# Patient Record
Sex: Female | Born: 1970 | Race: White | Hispanic: No | Marital: Married | Smoking: Former smoker
Health system: Southern US, Community
[De-identification: ages and names within clinical notes are randomized; demographics above are authoritative.]

## PROBLEM LIST (undated history)

## (undated) DIAGNOSIS — J189 Pneumonia, unspecified organism: Secondary | ICD-10-CM

## (undated) DIAGNOSIS — G4700 Insomnia, unspecified: Secondary | ICD-10-CM

## (undated) DIAGNOSIS — Z9049 Acquired absence of other specified parts of digestive tract: Secondary | ICD-10-CM

## (undated) DIAGNOSIS — R51 Headache: Secondary | ICD-10-CM

## (undated) DIAGNOSIS — F419 Anxiety disorder, unspecified: Secondary | ICD-10-CM

## (undated) DIAGNOSIS — R519 Headache, unspecified: Secondary | ICD-10-CM

## (undated) DIAGNOSIS — E669 Obesity, unspecified: Secondary | ICD-10-CM

## (undated) DIAGNOSIS — N369 Urethral disorder, unspecified: Secondary | ICD-10-CM

## (undated) DIAGNOSIS — I10 Essential (primary) hypertension: Secondary | ICD-10-CM

## (undated) DIAGNOSIS — M1990 Unspecified osteoarthritis, unspecified site: Secondary | ICD-10-CM

## (undated) DIAGNOSIS — S0300XA Dislocation of jaw, unspecified side, initial encounter: Secondary | ICD-10-CM

## (undated) HISTORY — DX: Insomnia, unspecified: G47.00

## (undated) HISTORY — DX: Urethral disorder, unspecified: N36.9

## (undated) HISTORY — DX: Acquired absence of other specified parts of digestive tract: Z90.49

## (undated) HISTORY — DX: Anxiety disorder, unspecified: F41.9

## (undated) HISTORY — PX: URETHRAL DILATION: SUR417

## (undated) HISTORY — DX: Headache: R51

## (undated) HISTORY — DX: Pneumonia, unspecified organism: J18.9

## (undated) HISTORY — DX: Obesity, unspecified: E66.9

## (undated) HISTORY — DX: Headache, unspecified: R51.9

## (undated) HISTORY — DX: Dislocation of jaw, unspecified side, initial encounter: S03.00XA

---
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
## 1999-02-18 ENCOUNTER — Other Ambulatory Visit: Admission: RE | Admit: 1999-02-18 | Discharge: 1999-02-18 | Payer: Self-pay | Admitting: Family Medicine

## 2002-03-05 ENCOUNTER — Other Ambulatory Visit: Admission: RE | Admit: 2002-03-05 | Discharge: 2002-03-05 | Payer: Self-pay | Admitting: Obstetrics and Gynecology

## 2003-03-15 ENCOUNTER — Emergency Department (HOSPITAL_COMMUNITY): Admission: AD | Admit: 2003-03-15 | Discharge: 2003-03-15 | Payer: Self-pay | Admitting: Family Medicine

## 2003-06-06 ENCOUNTER — Ambulatory Visit (HOSPITAL_COMMUNITY): Admission: RE | Admit: 2003-06-06 | Discharge: 2003-06-06 | Payer: Self-pay | Admitting: Neurosurgery

## 2003-06-24 ENCOUNTER — Encounter: Admission: RE | Admit: 2003-06-24 | Discharge: 2003-09-22 | Payer: Self-pay | Admitting: Neurosurgery

## 2005-06-25 ENCOUNTER — Encounter: Payer: Self-pay | Admitting: Vascular Surgery

## 2005-06-25 ENCOUNTER — Ambulatory Visit (HOSPITAL_COMMUNITY): Admission: RE | Admit: 2005-06-25 | Discharge: 2005-06-25 | Payer: Self-pay | Admitting: Emergency Medicine

## 2008-04-14 ENCOUNTER — Emergency Department (HOSPITAL_COMMUNITY): Admission: EM | Admit: 2008-04-14 | Discharge: 2008-04-15 | Payer: Self-pay | Admitting: Emergency Medicine

## 2008-08-10 ENCOUNTER — Inpatient Hospital Stay (HOSPITAL_COMMUNITY): Admission: AD | Admit: 2008-08-10 | Discharge: 2008-08-11 | Payer: Self-pay | Admitting: Obstetrics and Gynecology

## 2009-05-27 ENCOUNTER — Ambulatory Visit: Payer: Self-pay | Admitting: Diagnostic Radiology

## 2009-05-27 ENCOUNTER — Ambulatory Visit (HOSPITAL_BASED_OUTPATIENT_CLINIC_OR_DEPARTMENT_OTHER): Admission: RE | Admit: 2009-05-27 | Discharge: 2009-05-27 | Payer: Self-pay | Admitting: Family Medicine

## 2009-05-29 ENCOUNTER — Ambulatory Visit: Payer: Self-pay | Admitting: Diagnostic Radiology

## 2009-05-29 ENCOUNTER — Ambulatory Visit (HOSPITAL_BASED_OUTPATIENT_CLINIC_OR_DEPARTMENT_OTHER): Admission: RE | Admit: 2009-05-29 | Discharge: 2009-05-29 | Payer: Self-pay | Admitting: Family Medicine

## 2010-03-07 ENCOUNTER — Encounter: Payer: Self-pay | Admitting: Family Medicine

## 2010-05-24 LAB — CBC
HCT: 25.4 % — ABNORMAL LOW (ref 36.0–46.0)
HCT: 29 % — ABNORMAL LOW (ref 36.0–46.0)
Hemoglobin: 10 g/dL — ABNORMAL LOW (ref 12.0–15.0)
Hemoglobin: 8.8 g/dL — ABNORMAL LOW (ref 12.0–15.0)
MCHC: 34.6 g/dL (ref 30.0–36.0)
MCV: 90.2 fL (ref 78.0–100.0)
RBC: 2.84 MIL/uL — ABNORMAL LOW (ref 3.87–5.11)
RDW: 14.1 % (ref 11.5–15.5)
WBC: 13.4 10*3/uL — ABNORMAL HIGH (ref 4.0–10.5)
WBC: 15.6 10*3/uL — ABNORMAL HIGH (ref 4.0–10.5)

## 2010-05-27 LAB — COMPREHENSIVE METABOLIC PANEL
ALT: 32 U/L (ref 0–35)
AST: 28 U/L (ref 0–37)
Albumin: 3.3 g/dL — ABNORMAL LOW (ref 3.5–5.2)
Alkaline Phosphatase: 66 U/L (ref 39–117)
Calcium: 8.8 mg/dL (ref 8.4–10.5)
Creatinine, Ser: 0.52 mg/dL (ref 0.4–1.2)
GFR calc non Af Amer: >60 mL/min (ref >60)
Glucose, Bld: 94 mg/dL (ref 70–99)
Total Bilirubin: 0.4 mg/dL (ref 0.3–1.2)
Total Protein: 6.4 g/dL (ref 6.0–8.3)

## 2010-05-27 LAB — DIFFERENTIAL
Basophils Relative: 0 % (ref 0–1)
Eosinophils Absolute: 0 10*3/uL (ref 0.0–0.7)
Neutrophils Relative %: 71 % (ref 43–77)

## 2010-05-27 LAB — CBC
HCT: 33.7 % — ABNORMAL LOW (ref 36.0–46.0)
RBC: 3.48 MIL/uL — ABNORMAL LOW (ref 3.87–5.11)
RDW: 12.6 % (ref 11.5–15.5)
WBC: 11.5 10*3/uL — ABNORMAL HIGH (ref 4.0–10.5)

## 2010-05-27 LAB — LIPASE, BLOOD: Lipase: 23 U/L (ref 11–59)

## 2011-10-13 ENCOUNTER — Ambulatory Visit: Payer: Self-pay | Admitting: Internal Medicine

## 2011-11-25 ENCOUNTER — Ambulatory Visit (HOSPITAL_BASED_OUTPATIENT_CLINIC_OR_DEPARTMENT_OTHER)
Admission: RE | Admit: 2011-11-25 | Discharge: 2011-11-25 | Disposition: A | Discharge status: Home / Self Care | Payer: 59 | Source: Ambulatory Visit | Attending: Family Medicine | Admitting: Family Medicine

## 2011-11-25 ENCOUNTER — Other Ambulatory Visit (HOSPITAL_BASED_OUTPATIENT_CLINIC_OR_DEPARTMENT_OTHER): Payer: Self-pay | Admitting: Family Medicine

## 2011-11-25 DIAGNOSIS — J069 Acute upper respiratory infection, unspecified: Secondary | ICD-10-CM

## 2011-11-25 DIAGNOSIS — J018 Other acute sinusitis: Secondary | ICD-10-CM

## 2011-11-25 DIAGNOSIS — J323 Chronic sphenoidal sinusitis: Secondary | ICD-10-CM | POA: Insufficient documentation

## 2011-12-16 HISTORY — PX: INTRAUTERINE DEVICE (IUD) INSERTION: SHX5877

## 2011-12-21 ENCOUNTER — Other Ambulatory Visit (HOSPITAL_BASED_OUTPATIENT_CLINIC_OR_DEPARTMENT_OTHER): Payer: Self-pay | Admitting: Family Medicine

## 2011-12-21 DIAGNOSIS — Z1231 Encounter for screening mammogram for malignant neoplasm of breast: Secondary | ICD-10-CM

## 2011-12-22 ENCOUNTER — Inpatient Hospital Stay (HOSPITAL_BASED_OUTPATIENT_CLINIC_OR_DEPARTMENT_OTHER): Admission: RE | Admit: 2011-12-22 | Payer: 59 | Source: Ambulatory Visit

## 2011-12-23 ENCOUNTER — Inpatient Hospital Stay (HOSPITAL_BASED_OUTPATIENT_CLINIC_OR_DEPARTMENT_OTHER): Admission: RE | Admit: 2011-12-23 | Payer: 59 | Source: Ambulatory Visit

## 2011-12-23 ENCOUNTER — Ambulatory Visit (HOSPITAL_BASED_OUTPATIENT_CLINIC_OR_DEPARTMENT_OTHER)
Admission: RE | Admit: 2011-12-23 | Discharge: 2011-12-23 | Disposition: A | Discharge status: Home / Self Care | Payer: 59 | Source: Ambulatory Visit | Attending: Family Medicine | Admitting: Family Medicine

## 2011-12-23 DIAGNOSIS — Z1231 Encounter for screening mammogram for malignant neoplasm of breast: Secondary | ICD-10-CM

## 2011-12-23 DIAGNOSIS — R922 Inconclusive mammogram: Secondary | ICD-10-CM | POA: Insufficient documentation

## 2011-12-29 ENCOUNTER — Other Ambulatory Visit: Payer: Self-pay | Admitting: Family Medicine

## 2011-12-29 DIAGNOSIS — R928 Other abnormal and inconclusive findings on diagnostic imaging of breast: Secondary | ICD-10-CM

## 2012-01-06 ENCOUNTER — Ambulatory Visit
Admission: RE | Admit: 2012-01-06 | Discharge: 2012-01-06 | Disposition: A | Discharge status: Home / Self Care | Payer: 59 | Source: Ambulatory Visit | Attending: Family Medicine | Admitting: Family Medicine

## 2012-01-06 DIAGNOSIS — R928 Other abnormal and inconclusive findings on diagnostic imaging of breast: Secondary | ICD-10-CM

## 2012-01-11 ENCOUNTER — Encounter: Payer: Self-pay | Admitting: Obstetrics & Gynecology

## 2012-01-11 ENCOUNTER — Ambulatory Visit (INDEPENDENT_AMBULATORY_CARE_PROVIDER_SITE_OTHER): Payer: 59 | Admitting: Obstetrics & Gynecology

## 2012-01-11 VITALS — BP 131/85 | HR 98 | Temp 98.4°F | Resp 16 | Ht 64.0 in | Wt 170.0 lb

## 2012-01-11 DIAGNOSIS — Z124 Encounter for screening for malignant neoplasm of cervix: Secondary | ICD-10-CM

## 2012-01-11 DIAGNOSIS — Z01818 Encounter for other preprocedural examination: Secondary | ICD-10-CM

## 2012-01-11 DIAGNOSIS — Z01419 Encounter for gynecological examination (general) (routine) without abnormal findings: Secondary | ICD-10-CM

## 2012-01-11 DIAGNOSIS — Z0000 Encounter for general adult medical examination without abnormal findings: Secondary | ICD-10-CM

## 2012-01-11 DIAGNOSIS — N3946 Mixed incontinence: Secondary | ICD-10-CM

## 2012-01-11 DIAGNOSIS — Z1151 Encounter for screening for human papillomavirus (HPV): Secondary | ICD-10-CM

## 2012-01-11 MED ORDER — MISOPROSTOL 200 MCG PO TABS: ORAL_TABLET | ORAL | Status: DC

## 2012-01-11 NOTE — Addendum Note (Signed)
Addended by: Granville Lewis on: 01/11/2012 01:10 PM   Modules accepted: Orders

## 2012-01-11 NOTE — Progress Notes (Signed)
Subjective:    Valerie Merritt is a 41 y.o. female who presents for an annual exam. She complains of mixed incontinence and has to wear pads daily. She also reports her periods to be heavier and longer and she would like a Mirena for this and for birth control (uses condoms and withdrawal now) The patient is sexually active. GYN screening history: last pap: was normal. The patient wears seatbelts: yes. The patient participates in regular exercise: no. Has the patient ever been transfused or tattooed?: no. The patient reports that there is not domestic violence in her life.   Menstrual History: OB History    Grav Para Term Preterm Abortions TAB SAB Ect Mult Living   2 2 2       2       Menarche age: 12 Patient's last menstrual period was 12/23/2011.    The following portions of the patient's history were reviewed and updated as appropriate: allergies, current medications, past family history, past medical history, past social history, past surgical history and problem list.  Review of Systems A comprehensive review of systems was negative. She has been married for 20 years and denies dyspareunia. 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 She has had her flu vaccine. Lab work and mammogram are UTD.   Objective:    BP 131/85  Pulse 98  Temp 98.4 F (36.9 C) (Oral)  Resp 16  Ht 5\' 4"  (1.626 m)  Wt 170 lb (77.111 kg)  BMI 29.18 kg/m2  LMP 12/23/2011  General Appearance:    Alert, cooperative, no distress, appears stated age  Head:    Normocephalic, without obvious abnormality, atraumatic  Eyes:    PERRL, conjunctiva/corneas clear, EOM's intact, fundi    benign, both eyes  Ears:    Normal TM's and external ear canals, both ears  Nose:   Nares normal, septum midline, mucosa normal, no drainage    or sinus tenderness  Throat:   Lips, mucosa, and tongue normal; teeth and gums normal  Neck:   Supple, symmetrical, trachea midline, no adenopathy;    thyroid:  no  enlargement/tenderness/nodules; no carotid   bruit or JVD  Back:     Symmetric, no curvature, ROM normal, no CVA tenderness  Lungs:     Clear to auscultation bilaterally, respirations unlabored  Chest Wall:    No tenderness or deformity   Heart:    Regular rate and rhythm, S1 and S2 normal, no murmur, rub   or gallop  Breast Exam:    No tenderness, masses, or nipple abnormality  Abdomen:     Soft, non-tender, bowel sounds active all four quadrants,    no masses, no organomegaly  Genitalia:    Normal female without lesion, discharge or tenderness, small inclusion cyst at hymen ( 5 o'clock) NSSA, NT, no adnexal masses     Extremities:   Extremities normal, atraumatic, no cyanosis or edema  Pulses:   2+ and symmetric all extremities  Skin:   Skin color, texture, turgor normal, no rashes or lesions  Lymph nodes:   Cervical, supraclavicular, and axillary nodes normal  Neurologic:   CNII-XII intact, normal strength, sensation and reflexes    throughout  .    Assessment:    Healthy female exam.   Mixed incontinence Heavier periods and desire for birth control Plan:     Thin prep Pap smear.  Mirena prn Urology appt prn

## 2012-01-25 ENCOUNTER — Ambulatory Visit: Payer: 59 | Admitting: Obstetrics & Gynecology

## 2012-01-25 ENCOUNTER — Encounter: Payer: Self-pay | Admitting: Obstetrics & Gynecology

## 2012-01-25 ENCOUNTER — Ambulatory Visit (INDEPENDENT_AMBULATORY_CARE_PROVIDER_SITE_OTHER): Payer: 59 | Admitting: Obstetrics & Gynecology

## 2012-01-25 VITALS — BP 132/82 | HR 88 | Temp 98.2°F | Resp 16 | Ht 64.0 in | Wt 170.0 lb

## 2012-01-25 DIAGNOSIS — Z30430 Encounter for insertion of intrauterine contraceptive device: Secondary | ICD-10-CM

## 2012-01-25 DIAGNOSIS — Z01812 Encounter for preprocedural laboratory examination: Secondary | ICD-10-CM

## 2012-01-25 LAB — POCT URINE PREGNANCY: Preg Test, Ur: NEGATIVE

## 2012-01-25 MED ORDER — LEVONORGESTREL 20 MCG/24HR IU IUD
INTRAUTERINE_SYSTEM | Freq: Once | INTRAUTERINE | Status: AC
  Administered 2012-01-25: 11:00:00 via INTRAUTERINE

## 2012-01-25 NOTE — Progress Notes (Signed)
  Subjective:    Patient ID: Valerie Merritt, female    DOB: Jul 04, 1970, 41 y.o.   MRN: [id number]  HPI 41 yo MW lady who is here for Mirena insertion. She took cytotec last night in preparation for this.   Review of Systems     Objective:   Physical Exam  UPT negative, consent signed, time out done. NSSA, NT Cervix prepped with betadine. Mirena easily inserted. Uterus sounded to 8 cm. Strings cut to 3-4 cm She tolerated the procedure well.      Assessment & Plan:   Contraception as above RTC 4 weeks for string check visit

## 2012-03-07 ENCOUNTER — Ambulatory Visit: Payer: 59 | Admitting: Obstetrics & Gynecology

## 2012-03-14 ENCOUNTER — Ambulatory Visit: Payer: 59 | Admitting: Obstetrics & Gynecology

## 2012-03-31 ENCOUNTER — Other Ambulatory Visit: Payer: Self-pay

## 2012-07-19 ENCOUNTER — Encounter: Payer: Self-pay | Admitting: Family Medicine

## 2012-07-19 ENCOUNTER — Ambulatory Visit (INDEPENDENT_AMBULATORY_CARE_PROVIDER_SITE_OTHER): Payer: 59 | Admitting: Family Medicine

## 2012-07-19 VITALS — BP 121/85 | HR 77 | Ht 63.0 in | Wt 175.0 lb

## 2012-07-19 DIAGNOSIS — R51 Headache: Secondary | ICD-10-CM

## 2012-07-19 DIAGNOSIS — R519 Headache, unspecified: Secondary | ICD-10-CM

## 2012-07-19 MED ORDER — TIZANIDINE HCL 4 MG PO TABS: 4.0000 mg | ORAL_TABLET | Freq: Three times a day (TID) | ORAL | Status: DC | PRN

## 2012-07-19 NOTE — Progress Notes (Signed)
  Subjective:    Patient ID: Valerie Merritt, female    DOB: 1970/05/19, 42 y.o.   MRN: [id number]  Valerie Merritt is here today to discuss facial pain.  She feels that she might have sinusitis.    Sinusitis This is a recurrent problem. The current episode started in the past 7 days. The problem has been gradually worsening since onset. Her pain is at a severity of 3/10. The pain is mild. Associated symptoms include ear pain, headaches and sinus pressure. Past treatments include oral decongestants. The treatment provided mild relief.    Review of Systems  HENT: Positive for ear pain and sinus pressure.   Neurological: Positive for headaches.   Past Medical History  Diagnosis Date  . Anxiety   . Ureteral disorder    Family History  Problem Relation Age of Onset  . Diabetes Mother   . Heart disease Father   . Hypertension Mother   . 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  She wants to quit and plans to use the nicotine patch.     Alcohol Use:  None   Drug Use:  None   Diet:  Regular   Exercise:  Walking three times per week.    Hobbies: Read, Sowing.        Objective:   Physical Exam  Constitutional: She appears well-nourished. No distress.  HENT:  Head: Normocephalic.  Right Ear: Hearing, tympanic membrane, external ear and ear canal normal. No middle ear effusion.  Left Ear: Hearing, tympanic membrane, external ear and ear canal normal.  No middle ear effusion.  Mouth/Throat: Oropharynx is clear and moist. No oropharyngeal exudate.  Eyes: Conjunctivae are normal. Right eye exhibits no discharge.  Left eye exhibits no discharge.  Neck: Neck supple.  Cardiovascular: Normal rate, regular rhythm and normal heart sounds.  Exam reveals no gallop and no friction rub.   No murmur heard. Pulmonary/Chest: Effort normal and breath sounds normal. She has no wheezes. She exhibits no tenderness.  Lymphadenopathy:    She has no cervical adenopathy.  Neurological: She is alert.  Skin: Skin is warm and dry. No rash noted.  Psychiatric: She has a normal mood and affect.          Assessment & Plan:

## 2012-07-22 ENCOUNTER — Encounter: Payer: Self-pay | Admitting: Family Medicine

## 2012-07-22 DIAGNOSIS — R519 Headache, unspecified: Secondary | ICD-10-CM | POA: Insufficient documentation

## 2012-07-22 NOTE — Assessment & Plan Note (Signed)
Beauty's ears look normal.  She really does not have symptoms consistent with a sinus infection.  I wonder about TMJ.  She also mentioned that the pain is worse at night so this pain may be related to a tooth.  She says that she had pain like this before when she needed a root canal.  She was given some samples of Lyrica to see if this will help her pain.

## 2012-07-22 NOTE — Patient Instructions (Addendum)
1)  Facial Pain - This pain may be due to TMJ vs Facial Neuropathy vs Tooth Pain.  She will try a combination of NSAIDs and muscle relaxer and/or Lyrica.  If this does not improve, she may follow up with her dentist to make sure that this is not a problem with a tooth.    Trigeminal Neuralgia Trigeminal neuralgia is a nerve disorder that causes sudden attacks of severe facial pain. It is caused by damage to the trigeminal nerve, a major nerve in the face. It is more common in women and in the elderly, although it can also happen in younger patients. Attacks last from a few seconds to several minutes and can occur from a couple of times per year to several times per day. Trigeminal neuralgia can be a very distressing and disabling condition. Surgery may be needed in very severe cases if medical treatment does not give relief. HOME CARE INSTRUCTIONS   If your caregiver prescribed medication to help prevent attacks, take as directed.  To help prevent attacks:  Chew on the unaffected side of the mouth.  Avoid touching your face.  Avoid blasts of hot or cold air.  Men may wish to grow a beard to avoid having to shave. SEEK IMMEDIATE MEDICAL CARE IF:  Pain is unbearable and your medicine does not help.  You develop new, unexplained symptoms (problems).  You have problems that may be related to a medication you are taking. Document Released: 01/29/2000 Document Revised: 04/25/2011 Document Reviewed: 11/28/2008 [facility] Patient Information 2014 [place], [place].

## 2012-07-27 ENCOUNTER — Encounter: Payer: Self-pay | Admitting: Family Medicine

## 2012-07-27 ENCOUNTER — Ambulatory Visit (INDEPENDENT_AMBULATORY_CARE_PROVIDER_SITE_OTHER): Payer: 59 | Admitting: Family Medicine

## 2012-07-27 VITALS — BP 122/76 | HR 87 | Temp 97.5°F | Wt 175.0 lb

## 2012-07-27 DIAGNOSIS — R51 Headache: Secondary | ICD-10-CM

## 2012-07-27 DIAGNOSIS — R519 Headache, unspecified: Secondary | ICD-10-CM

## 2012-07-27 MED ORDER — CLINDAMYCIN HCL 300 MG PO CAPS: 300.0000 mg | ORAL_CAPSULE | Freq: Four times a day (QID) | ORAL | Status: AC

## 2012-07-27 NOTE — Progress Notes (Signed)
  Subjective:    Patient ID: Valerie Merritt, female    DOB: 08-11-1970, 42 y.o.   MRN: [id number]  Valerie Merritt was seen on 07/19/12 with left facial pain. At that time, we were trying to decide if her pain was due to a sinusitis, TMJ or dental pain.  She was given samples of Naprelan and Lyrica along with a prescription for Zanaflex.  She had some clindamycin at home and has taken it for the past 3 days.  This has helped her pain so she thinks that her pain is due to a tooth problem.  She has an appointment scheduled with a dentist.    Dental Pain  This is a new problem. The current episode started 1 to 4 weeks ago. The problem has been gradually worsening. The pain is at a severity of 5/10. The pain is moderate. Associated symptoms include facial pain. Pertinent negatives include no fever or sinus pressure. Treatments tried: Clindamycin for three days. The treatment provided moderate relief.   Review of Systems  Constitutional: Negative for fever.  HENT: Positive for dental problem. Negative for hearing loss, ear pain, congestion, facial swelling, rhinorrhea, sinus pressure and ear discharge.   Eyes: Negative.   Respiratory: Negative.    Past Medical History  Diagnosis Date  . Anxiety   . Ureteral disorder    Family History  Problem Relation Age of Onset  . Diabetes Mother   . Heart disease Father   . Hypertension Mother   . 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  She wants to quit and plans to use the nicotine patch.     Alcohol Use:  None   Drug Use:  None   Diet:  Regular   Exercise:  Walking three  times per week.    Hobbies: Read, Sowing.          Objective:   Physical Exam  Constitutional: She appears well-nourished. No distress.  Pulmonary/Chest: Effort normal and breath sounds normal.  Neurological: She is alert.  Skin: Skin is warm and dry.  Psychiatric: She has a normal mood and affect.          Assessment & Plan:

## 2012-08-26 DIAGNOSIS — R519 Headache, unspecified: Secondary | ICD-10-CM | POA: Insufficient documentation

## 2012-08-26 NOTE — Assessment & Plan Note (Signed)
She was given a prescription for Clindamycin.  She will follow up with her dentist.

## 2012-09-27 ENCOUNTER — Other Ambulatory Visit: Payer: 59

## 2012-09-28 ENCOUNTER — Other Ambulatory Visit: Payer: Self-pay | Admitting: *Deleted

## 2012-09-28 ENCOUNTER — Other Ambulatory Visit: Payer: 59

## 2012-09-28 DIAGNOSIS — E559 Vitamin D deficiency, unspecified: Secondary | ICD-10-CM

## 2012-09-28 DIAGNOSIS — Z0000 Encounter for general adult medical examination without abnormal findings: Secondary | ICD-10-CM

## 2012-09-28 LAB — CBC WITH DIFFERENTIAL/PLATELET
Basophils Absolute: 0 10*3/uL (ref 0.0–0.1)
Basophils Relative: 0 % (ref 0–1)
Eosinophils Absolute: 0.1 10*3/uL (ref 0.0–0.7)
Eosinophils Relative: 1 % (ref 0–5)
HCT: 38.1 % (ref 36.0–46.0)
Hemoglobin: 13.2 g/dL (ref 12.0–15.0)
Lymphocytes Relative: 35 % (ref 12–46)
Lymphs Abs: 2.9 10*3/uL (ref 0.7–4.0)
MCH: 30.4 pg (ref 26.0–34.0)
MCHC: 34.6 g/dL (ref 30.0–36.0)
MCV: 87.8 fL (ref 78.0–100.0)
Monocytes Absolute: 0.4 10*3/uL (ref 0.1–1.0)
Monocytes Relative: 5 % (ref 3–12)
Neutro Abs: 4.8 10*3/uL (ref 1.7–7.7)
Neutrophils Relative %: 59 % (ref 43–77)
Platelets: 336 10*3/uL (ref 150–400)
RBC: 4.34 MIL/uL (ref 3.87–5.11)
RDW: 14.9 % (ref 11.5–15.5)
WBC: 8.3 10*3/uL (ref 4.0–10.5)

## 2012-09-28 LAB — COMPLETE METABOLIC PANEL WITH GFR
ALT: 23 U/L (ref 0–35)
AST: 16 U/L (ref 0–37)
Albumin: 4.3 g/dL (ref 3.5–5.2)
Alkaline Phosphatase: 73 U/L (ref 39–117)
BUN: 12 mg/dL (ref 6–23)
CO2: 21 mEq/L (ref 19–32)
Calcium: 9 mg/dL (ref 8.4–10.5)
Chloride: 110 mEq/L (ref 96–112)
Creat: 0.84 mg/dL (ref 0.50–1.10)
GFR, Est African American: >89 mL/min
GFR, Est Non African American: 87 mL/min
Glucose, Bld: 110 mg/dL — ABNORMAL HIGH (ref 70–99)
Potassium: 4.4 mEq/L (ref 3.5–5.3)
Sodium: 139 mEq/L (ref 135–145)
Total Bilirubin: 0.5 mg/dL (ref 0.3–1.2)
Total Protein: 6.8 g/dL (ref 6.0–8.3)

## 2012-09-28 LAB — LIPID PANEL
Cholesterol: 162 mg/dL (ref 0–200)
HDL: 35 mg/dL — ABNORMAL LOW (ref >39)
LDL Cholesterol: 109 mg/dL — ABNORMAL HIGH (ref 0–99)
Total CHOL/HDL Ratio: 4.6 Ratio
Triglycerides: 89 mg/dL (ref <150)
VLDL: 18 mg/dL (ref 0–40)

## 2012-09-28 LAB — TSH: TSH: 2.029 u[IU]/mL (ref 0.350–4.500)

## 2012-09-29 LAB — VITAMIN D 25 HYDROXY (VIT D DEFICIENCY, FRACTURES): Vit D, 25-Hydroxy: 44 ng/mL (ref 30–89)

## 2012-10-12 ENCOUNTER — Ambulatory Visit: Payer: 59 | Admitting: Family Medicine

## 2012-10-19 ENCOUNTER — Encounter: Payer: Self-pay | Admitting: Family Medicine

## 2012-10-19 ENCOUNTER — Ambulatory Visit (INDEPENDENT_AMBULATORY_CARE_PROVIDER_SITE_OTHER): Payer: 59 | Admitting: Family Medicine

## 2012-10-19 VITALS — BP 114/78 | HR 90 | Wt 180.0 lb

## 2012-10-19 DIAGNOSIS — E669 Obesity, unspecified: Secondary | ICD-10-CM

## 2012-10-19 DIAGNOSIS — S0300XD Dislocation of jaw, unspecified side, subsequent encounter: Secondary | ICD-10-CM

## 2012-10-19 DIAGNOSIS — G4700 Insomnia, unspecified: Secondary | ICD-10-CM

## 2012-10-19 DIAGNOSIS — F411 Generalized anxiety disorder: Secondary | ICD-10-CM

## 2012-10-19 DIAGNOSIS — Z5189 Encounter for other specified aftercare: Secondary | ICD-10-CM

## 2012-10-19 MED ORDER — PHENDIMETRAZINE TARTRATE 35 MG PO TABS: 1.0000 | ORAL_TABLET | Freq: Three times a day (TID) | ORAL | Status: DC | PRN

## 2012-10-19 MED ORDER — ZALEPLON 10 MG PO CAPS: 10.0000 mg | ORAL_CAPSULE | Freq: Every day | ORAL | Status: DC

## 2012-10-19 MED ORDER — NAPROXEN SODIUM ER 750 MG PO TB24: 1.0000 | ORAL_TABLET | Freq: Two times a day (BID) | ORAL | Status: DC

## 2012-10-19 MED ORDER — CITALOPRAM HYDROBROMIDE 40 MG PO TABS: 40.0000 mg | ORAL_TABLET | Freq: Every day | ORAL | Status: DC

## 2012-10-19 NOTE — Progress Notes (Signed)
  Subjective:    Patient ID: Valerie Merritt, female    DOB: 1970/09/10, 42 y.o.   MRN: [id number]  HPI  Valerie Merritt is here today to go over her most recent lab results, to discuss the conditions listed below and to get some of her medication refilled.    1)  Mood:  She continues to do well with her Celexa 40 mg and needs to have it refilled.   2)  Sleeping Disturbances:  She has been struggling with this problem for several months.  She says that her husband has noticed that she is snoring more than she has in the past.  She is also grinding her teeth at night.  She has tried several home remedies and OTC treatments which have not helped her.    Review of Systems  Constitutional: Positive for unexpected weight change.  HENT: Negative.   Eyes: Negative.   Respiratory: Negative.   Cardiovascular: Negative.   Gastrointestinal: Negative.   Endocrine: Negative.   Genitourinary: Negative.   Musculoskeletal: Negative.   Skin: Negative.   Allergic/Immunologic: Negative.   Neurological: Negative.   Hematological: Negative.   Psychiatric/Behavioral: Positive for sleep disturbance.     Past Medical History  Diagnosis Date  . Anxiety   . Ureteral disorder   . TMJ (dislocation of temporomandibular joint)   . Insomnia   . Obesity      Past Surgical History  Procedure Laterality Date  . Appendectomy    . Intrauterine device (iud) insertion  12/2011     Family History  Problem Relation Age of Onset  . Diabetes Mother   . Heart disease Father   . Hypertension Mother   . Cancer - Colon Maternal Grandfather     History   Social History Narrative   Marital Status: Married Curator) [place]   Children:  Daughter Huntley Dec) Son Pamelia Hoit)     Pets: Dogs (3) Cat (1) Bird (1) Chickens (>20)  Ducks (>4)    Living Situation: Lives with husband and children.   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  She wants to quit and plans to use the nicotine patch.     Alcohol Use:  None    Drug Use:  None   Diet:  Regular   Exercise:  Walking three times per week.    Hobbies: Reading, Sewing       Objective:   Physical Exam  Vitals reviewed. Constitutional: She is oriented to person, place, and time. She appears well-developed and well-nourished. No distress.  Eyes: Conjunctivae are normal. No scleral icterus.  Neck: Neck supple. No thyromegaly present.  Cardiovascular: Normal rate, regular rhythm and normal heart sounds.   Pulmonary/Chest: Effort normal and breath sounds normal.  Musculoskeletal: She exhibits no edema and no tenderness.  Lymphadenopathy:    She has no cervical adenopathy.  Neurological: She is alert and oriented to person, place, and time.  Skin: Skin is warm and dry.  Psychiatric: She has a normal mood and affect. Her behavior is normal. Judgment and thought content normal.      Assessment & Plan:

## 2012-11-23 ENCOUNTER — Other Ambulatory Visit: Payer: Self-pay | Admitting: Family Medicine

## 2012-11-23 DIAGNOSIS — F411 Generalized anxiety disorder: Secondary | ICD-10-CM

## 2012-11-23 MED ORDER — DIAZEPAM 5 MG PO TABS: ORAL_TABLET | ORAL | Status: AC

## 2012-12-19 ENCOUNTER — Other Ambulatory Visit: Payer: Self-pay | Admitting: Family Medicine

## 2012-12-20 ENCOUNTER — Other Ambulatory Visit: Payer: Self-pay

## 2012-12-23 ENCOUNTER — Encounter: Payer: Self-pay | Admitting: Family Medicine

## 2012-12-23 DIAGNOSIS — G4700 Insomnia, unspecified: Secondary | ICD-10-CM | POA: Insufficient documentation

## 2012-12-23 DIAGNOSIS — F411 Generalized anxiety disorder: Secondary | ICD-10-CM | POA: Insufficient documentation

## 2012-12-23 DIAGNOSIS — S0300XA Dislocation of jaw, unspecified side, initial encounter: Secondary | ICD-10-CM | POA: Insufficient documentation

## 2012-12-23 DIAGNOSIS — E669 Obesity, unspecified: Secondary | ICD-10-CM | POA: Insufficient documentation

## 2012-12-23 NOTE — Assessment & Plan Note (Addendum)
Her mood is well controlled on citalopram 40 mg so she will stay on this dosage.

## 2012-12-23 NOTE — Assessment & Plan Note (Signed)
She was given a prescription for the Naprelan which worked well for her TMJ.

## 2012-12-23 NOTE — Assessment & Plan Note (Addendum)
She was given a prescription for Phendimetrazine to try to help her lose weight.  This should help decrease her snoring.

## 2012-12-23 NOTE — Assessment & Plan Note (Signed)
She has some difficulty at times with sleeping when she goes back and forth from days to nights in the ED.  She was given some Sonata to take occasionally.

## 2012-12-28 ENCOUNTER — Encounter: Payer: Self-pay | Admitting: Family Medicine

## 2012-12-28 ENCOUNTER — Ambulatory Visit (INDEPENDENT_AMBULATORY_CARE_PROVIDER_SITE_OTHER): Payer: 59 | Admitting: Family Medicine

## 2012-12-28 VITALS — BP 120/79 | HR 90 | Resp 16 | Ht 64.0 in | Wt 185.0 lb

## 2012-12-28 DIAGNOSIS — F17200 Nicotine dependence, unspecified, uncomplicated: Secondary | ICD-10-CM

## 2012-12-28 DIAGNOSIS — F411 Generalized anxiety disorder: Secondary | ICD-10-CM

## 2012-12-28 DIAGNOSIS — Z23 Encounter for immunization: Secondary | ICD-10-CM

## 2012-12-28 DIAGNOSIS — N760 Acute vaginitis: Secondary | ICD-10-CM

## 2012-12-28 DIAGNOSIS — Z0000 Encounter for general adult medical examination without abnormal findings: Secondary | ICD-10-CM

## 2012-12-28 LAB — POCT URINALYSIS DIPSTICK
Bilirubin, UA: NEGATIVE
Blood, UA: NEGATIVE
Glucose, UA: NEGATIVE
Ketones, UA: NEGATIVE
Leukocytes, UA: NEGATIVE
Nitrite, UA: NEGATIVE
Protein, UA: NEGATIVE
Spec Grav, UA: 1.02
Urobilinogen, UA: NEGATIVE
pH, UA: 5

## 2012-12-28 MED ORDER — CITALOPRAM HYDROBROMIDE 40 MG PO TABS: 40.0000 mg | ORAL_TABLET | Freq: Every day | ORAL | Status: DC

## 2012-12-28 MED ORDER — METRONIDAZOLE 0.75 % VA GEL: 1.0000 | Freq: Two times a day (BID) | VAGINAL | Status: DC

## 2012-12-28 NOTE — Patient Instructions (Signed)
1)  Preventative Care - You received your Tdap and Pneumovax at today's visit.   Complete the stool cards and return.  2)  Smoking Cessation - YOU CAN DO IT!!     You Can Quit Smoking If you are ready to quit smoking or are thinking about it, congratulations! You have chosen to help yourself be healthier and live longer! There are lots of different ways to quit smoking. Nicotine gum, nicotine patches, a nicotine inhaler, or nicotine nasal spray can help with physical craving. Hypnosis, support groups, and medicines help break the habit of smoking. TIPS TO GET OFF AND STAY OFF CIGARETTES  Learn to predict your moods. Do not let a bad situation be your excuse to have a cigarette. Some situations in your life might tempt you to have a cigarette.  Ask friends and co-workers not to smoke around you.  Make your home smoke-free.  Never have "just one" cigarette. It leads to wanting another and another. Remind yourself of your decision to quit.  On a card, make a list of your reasons for not smoking. Read it at least the same number of times a day as you have a cigarette. Tell yourself everyday, "I do not want to smoke. I choose not to smoke."  Ask someone at home or work to help you with your plan to quit smoking.  Have something planned after you eat or have a cup of coffee. Take a walk or get other exercise to perk you up. This will help to keep you from overeating.  Try a relaxation exercise to calm you down and decrease your stress. Remember, you may be tense and nervous the first two weeks after you quit. This will pass.  Find new activities to keep your hands busy. Play with a pen, coin, or rubber band. Doodle or draw things on paper.  Brush your teeth right after eating. This will help cut down the craving for the taste of tobacco after meals. You can try mouthwash too.  Try gum, breath mints, or diet candy to keep something in your mouth. IF YOU SMOKE AND WANT TO QUIT:  Do not stock  up on cigarettes. Never buy a carton. Wait until one pack is finished before you buy another.  Never carry cigarettes with you at work or at home.  Keep cigarettes as far away from you as possible. Leave them with someone else.  Never carry matches or a lighter with you.  Ask yourself, "Do I need this cigarette or is this just a reflex?"  Bet with someone that you can quit. Put cigarette money in a piggy bank every morning. If you smoke, you give up the money. If you do not smoke, by the end of the week, you keep the money.  Keep trying. It takes 21 days to change a habit!  Talk to your doctor about using medicines to help you quit. These include nicotine replacement gum, lozenges, or skin patches. Document Released: 11/27/2008 Document Revised: 04/25/2011 Document Reviewed: 11/27/2008 [facility] Patient Information 2014 [place], [place].

## 2012-12-28 NOTE — Progress Notes (Signed)
Subjective:    Patient ID: Valerie Merritt, female    DOB: May 22, 1970, 42 y.o.   MRN: [id number]  HPI  Everlynn is here today for her annual CPE.  She does not need a pap because she had one last year which was normal and she was noted to be negative for HPV.  She has done well since her last office visit.  She is still trying to quit smoking.     Review of Systems  Constitutional: Negative.   HENT: Negative.   Eyes: Negative.   Respiratory: Negative.   Cardiovascular: Negative.   Gastrointestinal: Negative.   Endocrine: Negative.   Genitourinary: Positive for vaginal discharge.  Musculoskeletal: Negative.   Skin: Negative.   Allergic/Immunologic: Negative.   Neurological: Negative.   Hematological: Negative.   Psychiatric/Behavioral: Negative.       Past Medical History  Diagnosis Date  . Anxiety   . TMJ (dislocation of temporomandibular joint)   . Insomnia   . Obesity   . 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    . Pneumonia      Past Surgical History  Procedure Laterality Date  . Appendectomy  1985  . Intrauterine device (iud) insertion  12/2011  . Urethral dilation       History   Social History Narrative   Marital Status: Married Curator) [place]   Children:  Daughter Huntley Dec) Son Pamelia Hoit)     Pets: Dogs (3) Cat (1) Bird (1) Chickens (>20)  Ducks (>4)    Living Situation: Lives with husband and children.   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  She wants to quit and is currently the nicotine patch.     Alcohol Use:  None    Drug Use:  None   Diet:  Regular   Exercise:  Walking three times per week.    Hobbies: Reading, Sewing       Family History  Problem Relation Age of Onset  . Diabetes Mother   . Hypertension Mother   . Heart disease Father   . Cancer - Colon Maternal  Grandfather   . Cancer Maternal Grandfather 50    Colon   . Multiple sclerosis Sister   . Hepatitis C Brother   . Other Brother     Avascular Necrosis  . Drug abuse Brother     Crack, Pain Meds   . Cancer Daughter     Hodgkin's Lymphoma   . Stroke Maternal Grandmother   . Hypertension Maternal Grandmother   . Cancer Sister     Brain      Current Outpatient Prescriptions on File Prior to Visit  Medication Sig Dispense Refill  . zaleplon (SONATA) 10 MG capsule Take 1 capsule (10 mg total) by mouth at bedtime.  30 capsule  1   No current facility-administered medications on file prior to visit.     Allergies  Allergen Reactions  . Ivp Dye [Iodinated Diagnostic Agents] Anaphylaxis  . Penicillins Rash     Immunization History  Administered Date(s) Administered  . Influenza-Unspecified 10/29/2012  . Pneumococcal Polysaccharide-23 12/28/2012  . Tdap 12/28/2012      Objective:   Physical Exam  Vitals reviewed. Constitutional: She is oriented to person, place, and time. She appears well-developed and well-nourished.  HENT:  Head: Normocephalic and  atraumatic.  Right Ear: External ear normal.  Left Ear: External ear normal.  Nose: Nose normal.  Mouth/Throat: Oropharynx is clear and moist.  Eyes: Conjunctivae and EOM are normal. Pupils are equal, round, and reactive to light.  Neck: Normal range of motion. No thyromegaly present.  Cardiovascular: Normal rate, regular rhythm, normal heart sounds and intact distal pulses.  Exam reveals no gallop and no friction rub.   No murmur heard. Pulmonary/Chest: Effort normal and breath sounds normal. Right breast exhibits no inverted nipple, no mass, no nipple discharge, no skin change and no tenderness. Left breast exhibits no inverted nipple, no mass, no nipple discharge, no skin change and no tenderness. Breasts are symmetrical.  Abdominal: Soft. Bowel sounds are normal.  Musculoskeletal: Normal range of motion. She exhibits no  edema and no tenderness.  Lymphadenopathy:    She has no cervical adenopathy.  Neurological: She is alert and oriented to person, place, and time. She has normal reflexes.  Skin: Skin is warm and dry.  Psychiatric: She has a normal mood and affect. Her behavior is normal. Judgment and thought content normal.      Assessment & Plan:    Shatasha was seen today for annual exam and medication refills.    Diagnoses and associated orders for this visit:  Routine general medical examination at a health care facility - POCT urinalysis dipstick  Need for prophylactic vaccination with combined diphtheria-tetanus-pertussis (DTP) vaccine - Tdap vaccine greater than or equal to 7yo IM  Need for prophylactic vaccination against Streptococcus pneumoniae (pneumococcus) - Pneumococcal polysaccharide vaccine 23-valent greater than or equal to 2yo subcutaneous/IM  Tobacco use disorder - She is going to continue to work on smoking cessation.    Anxiety state, unspecified - citalopram (CELEXA) 40 MG tablet; Take 1 tablet (40 mg total) by mouth at bedtime.  Vaginitis and vulvovaginitis - Discontinue: metroNIDAZOLE (METROGEL VAGINAL) 0.75 % vaginal gel; Place 1 Applicatorful vaginally 2 (two) times daily.  TIME SPENT "FACE TO FACE" WITH PATIENT -  45 MINS

## 2013-01-02 ENCOUNTER — Other Ambulatory Visit: Payer: Self-pay | Admitting: Family Medicine

## 2013-01-02 DIAGNOSIS — H04203 Unspecified epiphora, bilateral lacrimal glands: Secondary | ICD-10-CM

## 2013-01-02 MED ORDER — AZELASTINE HCL 0.05 % OP SOLN: 1.0000 [drp] | Freq: Two times a day (BID) | OPHTHALMIC | Status: DC

## 2013-01-04 ENCOUNTER — Other Ambulatory Visit: Payer: Self-pay | Admitting: *Deleted

## 2013-01-04 DIAGNOSIS — Z1239 Encounter for other screening for malignant neoplasm of breast: Secondary | ICD-10-CM

## 2013-01-25 ENCOUNTER — Ambulatory Visit (INDEPENDENT_AMBULATORY_CARE_PROVIDER_SITE_OTHER): Payer: 59 | Admitting: Family Medicine

## 2013-01-25 ENCOUNTER — Encounter: Payer: Self-pay | Admitting: Family Medicine

## 2013-01-25 VITALS — BP 136/80 | HR 81 | Resp 16 | Wt 185.0 lb

## 2013-01-25 DIAGNOSIS — L0390 Cellulitis, unspecified: Secondary | ICD-10-CM

## 2013-01-25 DIAGNOSIS — L0291 Cutaneous abscess, unspecified: Secondary | ICD-10-CM

## 2013-01-25 MED ORDER — AZITHROMYCIN 500 MG PO TABS: 500.0000 mg | ORAL_TABLET | Freq: Every day | ORAL | Status: AC

## 2013-01-25 NOTE — Progress Notes (Signed)
Subjective:    Patient ID: Valerie Merritt, female    DOB: [birth year], 42 y.o.   MRN: [id number]  HPI  Valerie Merritt is here today complaining of pain and swelling in her right middle finger.  She has been experiencing these symptoms intermittently for the past two months.  She thinks that this problem may be related to a welding particle that she thought it may be gotten in her finger.  She also has been using wool and she wonders if the fine particles are getting in her finger.     Review of Systems  Constitutional: Negative for fever.  Musculoskeletal: Positive for arthralgias and joint swelling.  Skin:       Dry white patches on her right middle finger  Neurological: Positive for numbness.       Right middle finger      Past Medical History  Diagnosis Date  . Anxiety   . TMJ (dislocation of temporomandibular joint)   . Insomnia   . Obesity   . 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    . Pneumonia      Past Surgical History  Procedure Laterality Date  . Appendectomy  1985  . Intrauterine device (iud) insertion  12/2011  . Urethral dilation       History   Social History Narrative   Marital Status: Married Curator) [place]   Children:  Daughter Huntley Dec) Son Pamelia Hoit)     Pets: Dogs (3) Cat (1) Bird (1) Chickens (>20)  Ducks (>4)    Living Situation: Lives with husband and children.   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  She wants to quit and is currently the nicotine patch.     Alcohol Use:  None    Drug Use:  None   Diet:  Regular   Exercise:  Walking three times per week.    Hobbies: Reading, Sewing       Family History  Problem Relation Age of Onset  . Diabetes Mother   . Hypertension Mother   . Heart disease Father   . Cancer - Colon Maternal Grandfather   . Cancer Maternal  Grandfather 50    Colon   . Multiple sclerosis Sister   . Hepatitis C Brother   . Other Brother     Avascular Necrosis  . Drug abuse Brother     Crack, Pain Meds   . Cancer Daughter     Hodgkin's Lymphoma   . Stroke Maternal Grandmother   . Hypertension Maternal Grandmother   . Cancer Sister     Brain      Current Outpatient Prescriptions on File Prior to Visit  Medication Sig Dispense Refill  . citalopram (CELEXA) 40 MG tablet Take 1 tablet (40 mg total) by mouth at bedtime.  90 tablet  0  . zaleplon (SONATA) 10 MG capsule Take 1 capsule (10 mg total) by mouth at bedtime.  30 capsule  1   No current facility-administered medications on file prior to visit.     Allergies  Allergen Reactions  . Ivp Dye [Iodinated Diagnostic Agents] Anaphylaxis  . Penicillins Rash     Immunization History  Administered Date(s) Administered  . Influenza-Unspecified 10/29/2012  . Pneumococcal Polysaccharide-23 12/28/2012  . Tdap 12/28/2012  Objective:   Physical Exam  Constitutional: No distress.  Musculoskeletal: She exhibits edema and tenderness.  Right Middle Finger   Skin: Rash noted. There is erythema.      Assessment & Plan:    Valerie Merritt was seen today for hand pain.  Diagnoses and associated orders for this visit:  Cellulitis - azithromycin (ZITHROMAX) 500 MG tablet; Take 1 tablet (500 mg total) by mouth daily. Take 1 tablet daily for 3 days.

## 2013-02-04 ENCOUNTER — Other Ambulatory Visit: Payer: Self-pay | Admitting: Family Medicine

## 2013-02-04 DIAGNOSIS — Z20828 Contact with and (suspected) exposure to other viral communicable diseases: Secondary | ICD-10-CM

## 2013-02-04 MED ORDER — OSELTAMIVIR PHOSPHATE 75 MG PO CAPS: 75.0000 mg | ORAL_CAPSULE | Freq: Every day | ORAL | Status: DC

## 2013-02-18 DIAGNOSIS — Z0000 Encounter for general adult medical examination without abnormal findings: Secondary | ICD-10-CM | POA: Insufficient documentation

## 2013-02-18 DIAGNOSIS — E669 Obesity, unspecified: Secondary | ICD-10-CM | POA: Insufficient documentation

## 2013-02-21 ENCOUNTER — Ambulatory Visit: Payer: 59 | Admitting: Family Medicine

## 2013-02-22 ENCOUNTER — Ambulatory Visit (INDEPENDENT_AMBULATORY_CARE_PROVIDER_SITE_OTHER): Payer: 59 | Admitting: Family Medicine

## 2013-02-22 ENCOUNTER — Encounter (HOSPITAL_COMMUNITY): Payer: Self-pay | Admitting: Emergency Medicine

## 2013-02-22 ENCOUNTER — Emergency Department (HOSPITAL_COMMUNITY): Payer: 59

## 2013-02-22 ENCOUNTER — Inpatient Hospital Stay (HOSPITAL_COMMUNITY)
Admission: EM | Admit: 2013-02-22 | Discharge: 2013-02-23 | DRG: 203 | Disposition: A | Discharge status: Home / Self Care | Payer: 59 | Attending: Internal Medicine | Admitting: Internal Medicine

## 2013-02-22 ENCOUNTER — Encounter: Payer: Self-pay | Admitting: Family Medicine

## 2013-02-22 VITALS — BP 122/78 | HR 86 | Temp 98.8°F | Resp 16 | Wt 180.0 lb

## 2013-02-22 DIAGNOSIS — F17200 Nicotine dependence, unspecified, uncomplicated: Secondary | ICD-10-CM | POA: Diagnosis present

## 2013-02-22 DIAGNOSIS — D72829 Elevated white blood cell count, unspecified: Secondary | ICD-10-CM

## 2013-02-22 DIAGNOSIS — R05 Cough: Secondary | ICD-10-CM

## 2013-02-22 DIAGNOSIS — G4700 Insomnia, unspecified: Secondary | ICD-10-CM | POA: Diagnosis present

## 2013-02-22 DIAGNOSIS — Z79899 Other long term (current) drug therapy: Secondary | ICD-10-CM

## 2013-02-22 DIAGNOSIS — Z0000 Encounter for general adult medical examination without abnormal findings: Secondary | ICD-10-CM

## 2013-02-22 DIAGNOSIS — Z880 Allergy status to penicillin: Secondary | ICD-10-CM

## 2013-02-22 DIAGNOSIS — R519 Headache, unspecified: Secondary | ICD-10-CM

## 2013-02-22 DIAGNOSIS — J329 Chronic sinusitis, unspecified: Secondary | ICD-10-CM

## 2013-02-22 DIAGNOSIS — J189 Pneumonia, unspecified organism: Secondary | ICD-10-CM

## 2013-02-22 DIAGNOSIS — E669 Obesity, unspecified: Secondary | ICD-10-CM | POA: Diagnosis present

## 2013-02-22 DIAGNOSIS — R059 Cough, unspecified: Secondary | ICD-10-CM

## 2013-02-22 DIAGNOSIS — R51 Headache: Secondary | ICD-10-CM

## 2013-02-22 DIAGNOSIS — N3946 Mixed incontinence: Secondary | ICD-10-CM

## 2013-02-22 DIAGNOSIS — S0300XD Dislocation of jaw, unspecified side, subsequent encounter: Secondary | ICD-10-CM

## 2013-02-22 DIAGNOSIS — J209 Acute bronchitis, unspecified: Principal | ICD-10-CM | POA: Diagnosis present

## 2013-02-22 DIAGNOSIS — J069 Acute upper respiratory infection, unspecified: Secondary | ICD-10-CM | POA: Diagnosis present

## 2013-02-22 DIAGNOSIS — R0602 Shortness of breath: Secondary | ICD-10-CM

## 2013-02-22 DIAGNOSIS — F411 Generalized anxiety disorder: Secondary | ICD-10-CM | POA: Diagnosis present

## 2013-02-22 DIAGNOSIS — Z91041 Radiographic dye allergy status: Secondary | ICD-10-CM

## 2013-02-22 LAB — BASIC METABOLIC PANEL
BUN: 13 mg/dL (ref 6–23)
CO2: 20 meq/L (ref 19–32)
Calcium: 9.2 mg/dL (ref 8.4–10.5)
Chloride: 104 mEq/L (ref 96–112)
Creatinine, Ser: 0.77 mg/dL (ref 0.50–1.10)
GFR calc Af Amer: >90 mL/min (ref >90)
GFR calc non Af Amer: >90 mL/min (ref >90)
Glucose, Bld: 129 mg/dL — ABNORMAL HIGH (ref 70–99)
POTASSIUM: 4.2 meq/L (ref 3.7–5.3)
SODIUM: 141 meq/L (ref 137–147)

## 2013-02-22 LAB — D-DIMER, QUANTITATIVE (NOT AT ARMC): D-Dimer, Quant: 0.34 ug/mL-FEU (ref 0.00–0.48)

## 2013-02-22 LAB — CBC WITH DIFFERENTIAL/PLATELET
BASOS ABS: 0 10*3/uL (ref 0.0–0.1)
Basophils Relative: 0 % (ref 0–1)
EOS PCT: 0 % (ref 0–5)
Eosinophils Absolute: 0 10*3/uL (ref 0.0–0.7)
HCT: 41.1 % (ref 36.0–46.0)
Hemoglobin: 14.6 g/dL (ref 12.0–15.0)
LYMPHS ABS: 1.3 10*3/uL (ref 0.7–4.0)
LYMPHS PCT: 6 % — AB (ref 12–46)
MCH: 33.1 pg (ref 26.0–34.0)
MCHC: 35.5 g/dL (ref 30.0–36.0)
MCV: 93.2 fL (ref 78.0–100.0)
Monocytes Absolute: 0.1 10*3/uL (ref 0.1–1.0)
Monocytes Relative: 1 % — ABNORMAL LOW (ref 3–12)
NEUTROS ABS: 21.7 10*3/uL — AB (ref 1.7–7.7)
NEUTROS PCT: 94 % — AB (ref 43–77)
PLATELETS: 305 10*3/uL (ref 150–400)
RBC: 4.41 MIL/uL (ref 3.87–5.11)
RDW: 12.7 % (ref 11.5–15.5)
WBC: 23.1 10*3/uL — ABNORMAL HIGH (ref 4.0–10.5)

## 2013-02-22 MED ORDER — SODIUM CHLORIDE 0.9 % IV BOLUS (SEPSIS)
1000.0000 mL | Freq: Once | INTRAVENOUS | Status: AC
  Administered 2013-02-22: 1000 mL via INTRAVENOUS

## 2013-02-22 MED ORDER — ALBUTEROL SULFATE HFA 108 (90 BASE) MCG/ACT IN AERS
6.0000 | INHALATION_SPRAY | Freq: Once | RESPIRATORY_TRACT | Status: AC
  Administered 2013-02-22: 6 via RESPIRATORY_TRACT
  Filled 2013-02-22: qty 6.7

## 2013-02-22 MED ORDER — MOXIFLOXACIN HCL 400 MG PO TABS: 400.0000 mg | ORAL_TABLET | Freq: Every day | ORAL | Status: AC

## 2013-02-22 MED ORDER — METHYLPREDNISOLONE SODIUM SUCC 125 MG IJ SOLR
125.0000 mg | Freq: Once | INTRAMUSCULAR | Status: AC
  Administered 2013-02-22: 125 mg via INTRAMUSCULAR

## 2013-02-22 MED ORDER — EPINEPHRINE 0.3 MG/0.3ML IJ SOAJ
0.3000 mg | Freq: Once | INTRAMUSCULAR | Status: DC
  Filled 2013-02-22: qty 0.3

## 2013-02-22 MED ORDER — HYDROCOD POLST-CHLORPHEN POLST 10-8 MG/5ML PO LQCR: 5.0000 mL | Freq: Two times a day (BID) | ORAL | Status: DC | PRN

## 2013-02-22 MED ORDER — FLUTICASONE PROPIONATE 50 MCG/ACT NA SUSP: 2.0000 | Freq: Every day | NASAL | Status: DC

## 2013-02-22 NOTE — ED Provider Notes (Signed)
CSN: [id number]     Arrival date & time 02/22/13  1842 History   First MD Initiated Contact with Patient 02/22/13 1902     Chief Complaint  Patient presents with  . Shortness of Breath   (Consider location/radiation/quality/duration/timing/severity/associated sxs/prior Treatment) Patient is a 43 y.o. female presenting with shortness of breath.  Shortness of Breath Severity:  Severe Onset quality:  Sudden Duration: a few  hours ago. Timing:  Constant Progression:  Improving Chronicity:  New Context comment:  Has had sinus symptoms for over a week.  Prescribed avelox and dulera today.  Took dulera, and then 20 minutes later had heart racing and shortness of breath. Relieved by:  Rest Worsened by:  Nothing tried Ineffective treatments: EMS gave albuterol and solumedrol. Associated symptoms: cough and sputum production   Associated symptoms: no abdominal pain, no chest pain, no fever and no vomiting     Past Medical History  Diagnosis Date  . Anxiety   . TMJ (dislocation of temporomandibular joint)   . Insomnia   . Obesity   . 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     Past Surgical History  Procedure Laterality Date  . Appendectomy  1985  . Intrauterine device (iud) insertion  12/2011  . Urethral dilation     Family History  Problem Relation Age of Onset  . Diabetes Mother   . Hypertension Mother   . Heart disease Father   . Cancer - Colon Maternal Grandfather   . Cancer Maternal Grandfather 50    Colon   . Multiple sclerosis Sister   . Hepatitis C Brother   . Other Brother     Avascular Necrosis  . Drug abuse Brother     Crack, Pain Meds   . Cancer Daughter     Hodgkin's Lymphoma   . Stroke Maternal Grandmother   . Hypertension Maternal Grandmother   . Cancer Sister     Brain    History  Substance Use Topics  . Smoking status: Current Every Day Smoker -- 1.00 packs/day for 25 years    Types:  Cigarettes  . Smokeless tobacco: Never Used     Comment: She is down to 1/2 ppd; She is using the patch occasionally.   . Alcohol Use: No   OB History   Grav Para Term Preterm Abortions TAB SAB Ect Mult Living   2 2 2       2      Review of Systems  Constitutional: Negative for fever.  HENT: Negative for congestion.   Respiratory: Positive for cough, sputum production and shortness of breath.   Cardiovascular: Negative for chest pain.  Gastrointestinal: Negative for nausea, vomiting, abdominal pain and diarrhea.  All other systems reviewed and are negative.    Allergies  Ivp dye and Penicillins  Home Medications   Current Outpatient Rx  Name  Route  Sig  Dispense  Refill  . cholecalciferol (VITAMIN D) 1000 UNITS tablet   Oral   Take 1,000 Units by mouth daily.         . citalopram (CELEXA) 40 MG tablet   Oral   Take 1 tablet (40 mg total) by mouth at bedtime.   90 tablet   0   . fluticasone (FLONASE) 50 MCG/ACT nasal spray   Each Nare   Place 2 sprays into both nostrils at bedtime.   16 g   12   .  ibuprofen (ADVIL,MOTRIN) 600 MG tablet   Oral   Take 600 mg by mouth every 6 (six) hours as needed for fever.         . mometasone-formoterol (DULERA) 100-5 MCG/ACT AERO   Inhalation   Inhale 2 puffs into the lungs 2 (two) times daily.         Marland Kitchen moxifloxacin (AVELOX) 400 MG tablet   Oral   Take 1 tablet (400 mg total) by mouth daily.   6 tablet   0     Sample given   . Multiple Vitamin (MULTIVITAMIN) tablet   Oral   Take 1 tablet by mouth daily.         . Naproxen Sodium 750 MG TB24   Oral   Take 1 tablet by mouth daily as needed. Sinus headache         . zaleplon (SONATA) 10 MG capsule   Oral   Take 1 capsule (10 mg total) by mouth at bedtime.   30 capsule   1    BP 131/90  Pulse 109  Temp(Src) 98.2 F (36.8 C) (Oral)  SpO2 96% Physical Exam  Nursing note and vitals reviewed. Constitutional: She is oriented to person, place, and  time. She appears well-developed and well-nourished. No distress.  HENT:  Head: Normocephalic and atraumatic.  Mouth/Throat: Oropharynx is clear and moist.  Eyes: Conjunctivae are normal. Pupils are equal, round, and reactive to light. No scleral icterus.  Neck: Neck supple.  Cardiovascular: Normal rate, regular rhythm, normal heart sounds and intact distal pulses.   No murmur heard. Pulmonary/Chest: No stridor. Tachypnea noted. She is in respiratory distress. She has no wheezes. She has rales (bibasilar).  Abdominal: Soft. Bowel sounds are normal. She exhibits no distension. There is no tenderness. There is no rigidity, no rebound and no guarding.  Musculoskeletal: Normal range of motion.  Neurological: She is alert and oriented to person, place, and time.  Skin: Skin is warm and dry. No rash noted.  Psychiatric: She has a normal mood and affect. Her behavior is normal.    ED Course  Procedures (including critical care time) Labs Review Labs Reviewed  CBC WITH DIFFERENTIAL - Abnormal; Notable for the following:    WBC 23.1 (*)    Neutrophils Relative % 94 (*)    Neutro Abs 21.7 (*)    Lymphocytes Relative 6 (*)    Monocytes Relative 1 (*)    All other components within normal limits  BASIC METABOLIC PANEL - Abnormal; Notable for the following:    Glucose, Bld 129 (*)    All other components within normal limits  D-DIMER, QUANTITATIVE   Imaging Review Dg Chest 2 View  02/22/2013   CLINICAL DATA:  Cough  EXAM: CHEST  2 VIEW  COMPARISON:  None.  FINDINGS: Cardiac shadow is within normal limits. Bibasilar atelectatic changes are seen without focal confluent infiltrate. No acute bony abnormality is noted.  IMPRESSION: Bibasilar atelectatic changes   Electronically Signed   By: Alcide Clever M.D.   On: 02/22/2013 20:41  All radiology studies independently viewed by me.     EKG Interpretation    Date/Time:  Friday February 22 2013 18:49:09 EST Ventricular Rate:  111 PR  Interval:  142 QRS Duration: 97 QT Interval:  377 QTC Calculation: 512 R Axis:   87 Text Interpretation:  Age not entered, assumed to be  43 years old for purpose of ECG interpretation Sinus tachycardia Consider right atrial enlargement Probable inferior infarct, old Prolonged  QT interval No old tracing to compare Confirmed by [facility]  MD, TREY ([id number]) on 02/22/2013 8:50:54 PM            MDM   1. CAP (community acquired pneumonia)   2. URI (upper respiratory infection)   3. Leucocytosis   4. Shortness of breath    43 yo female with sinusitis for the last week, treated with azithromycin without improvement, started on avelox and Dulera today, who presents with sudden onset coughing fit, shortness of breath, and rapid heart rate.  Symptoms initially concerning for an adverse drug reaction, potentially to [facility] (which she reports taking about 20 minutes prior to the sudden onset of her worsened symptoms).  She went to urgent care and was sent to ED due to tachycardia.  Breathing improved somewhat after albuterol and solumedrol, administered by EMS.  She refused EpiPen due to fear of worsening tachycardia. (she did not have other symptoms of anaphylactic reaction, but degree of SOB raised concern for it).  During remainder of ED course, she had persistent increased WOB and lower lung field rales.  Albuterol did not significantly improve symptoms.  Due to increased WOB, will admit.    Candyce Churn, MD 02/23/13 1324

## 2013-02-22 NOTE — ED Notes (Addendum)
[facility] c/o severe SOB, pt speaks in short sentences. Pt states she sees her PCP today and diagnosed with bronchitis. Per EMS decreased lung prior to Albuterol Tx. EMS given 5mg  atrovent and 125mg  soulmedrol; urgent care given 125mg  soulmedrol. Per EMS, BP-110/70, HR-110, 99% on 2L [place], RR-22.

## 2013-02-22 NOTE — Progress Notes (Signed)
Subjective:    Patient ID: Valerie Merritt, female    DOB: Aug 22, 1970, 43 y.o.   MRN: [id number]  Valerie Merritt is here today complaining of URI symptoms including sinus pressure, sore throat and cough.  She has been sick for the past two weeks with the same symptoms and feels that she is worsening instead of getting better.  She was treated by her Dr. Constance Goltz with a Z-pak which has not really seem to do anything for her.       Sinusitis This is a recurrent problem. The current episode started 1 to 4 weeks ago. The maximum temperature recorded prior to her arrival was 101 - 101.9 F. The fever has been present for 1 to 2 days. Associated symptoms include coughing, ear pain, headaches, shortness of breath, sinus pressure and a sore throat. Treatments tried: Tamiflu, Hall's Cough drops, [place] ColdCare, Alka Seltzer Cold, [employer] and a Z-Pack.  Cough Associated symptoms include ear pain, headaches, rhinorrhea, a sore throat and shortness of breath.    Review of Systems  Constitutional: Positive for fatigue.  HENT: Positive for ear pain, rhinorrhea, sinus pressure and sore throat.   Respiratory: Positive for cough and shortness of breath.   Neurological: Positive for headaches.    Past Medical History  Diagnosis Date  . Anxiety   . TMJ (dislocation of temporomandibular joint)   . Insomnia   . Obesity   . 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       Past Surgical History  Procedure Laterality Date  . Appendectomy  1985  . Intrauterine device (iud) insertion  12/2011  . Urethral dilation       History   Social History Narrative   Marital Status: Married Curator) [place]   Children:  Daughter Huntley Dec) Son Pamelia Hoit)     Pets: Dogs (3) Cat (1) Bird (1) Chickens (>20)  Ducks (>4)    Living Situation: Lives with husband and children.   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  She wants to quit and is currently the nicotine patch.     Alcohol Use:  None    Drug Use:  None   Diet:  Regular   Exercise:  Walking three times per week.    Hobbies: Reading, Sewing       Family History  Problem Relation Age of Onset  . Diabetes Mother   . Hypertension Mother   . Heart disease Father   . Cancer - Colon Maternal Grandfather   . Cancer Maternal Grandfather 50    Colon   . Multiple sclerosis Sister   . Hepatitis C Brother   . Other Brother     Avascular Necrosis  . Drug abuse Brother     Crack, Pain Meds   . Cancer Daughter     Hodgkin's Lymphoma   . Stroke Maternal Grandmother   . Hypertension Maternal Grandmother   . Cancer Sister     Brain      Current Outpatient Prescriptions on File Prior to Visit  Medication Sig Dispense Refill  . citalopram (CELEXA) 40 MG tablet Take 1 tablet (40 mg total) by mouth at bedtime.  90 tablet  0  . Multiple Vitamin (MULTIVITAMIN) tablet Take 1 tablet by mouth daily.      . zaleplon (  SONATA) 10 MG capsule Take 1 capsule (10 mg total) by mouth at bedtime.  30 capsule  1   No current facility-administered medications on file prior to visit.     Allergies  Allergen Reactions  . Ivp Dye [Iodinated Diagnostic Agents] Anaphylaxis  . Penicillins Rash     Immunization History  Administered Date(s) Administered  . Influenza-Unspecified 10/29/2012  . Pneumococcal Polysaccharide-23 12/28/2012  . Tdap 12/28/2012       Objective:   Physical Exam  Nursing note and vitals reviewed. Constitutional: Vital signs are normal. She appears well-nourished. She is cooperative. She appears ill. No distress.  HENT:  Head: Normocephalic.  Nose: Right sinus exhibits maxillary sinus tenderness and frontal sinus tenderness. Left sinus exhibits maxillary sinus tenderness and frontal sinus tenderness.  Mouth/Throat: No oropharyngeal exudate.  Eyes:  Conjunctivae are normal. Right eye exhibits no discharge. Left eye exhibits no discharge.  Neck: Neck supple.  Cardiovascular: Normal rate, regular rhythm and normal heart sounds.  Exam reveals no gallop and no friction rub.   No murmur heard. Pulmonary/Chest: Effort normal and breath sounds normal. She has no wheezes. She exhibits no tenderness.  Lymphadenopathy:    She has no cervical adenopathy.  Neurological: She is alert.  Skin: Skin is warm and dry. No rash noted.  Psychiatric: She has a normal mood and affect.       Assessment & Plan:    Karen KitchensBobbie was seen today for sinusitis and cough.  Diagnoses and associated orders for this visit:  Sinusitis Comments: Since she has been sick for 2 weeks and not getting better despite the fact that she has completed a Z-pak, we'll let her try a stronger antibiotic.  We had a long discussion about her allergic reaction to PCN.  Her chart had her reaction listed as anaphylaxis but she believes that she just had a rash.  I contemplated giving her a cephalosporin but did not with this PCN allergy since we are going into the weekend.  She seems a bit unsure about her PCN allergy so we might consider giving her a test in the future.    - moxifloxacin (AVELOX) 400 MG tablet; Take 1 tablet (400 mg total) by mouth daily. (Samples given)  - fluticasone (FLONASE) 50 MCG/ACT nasal spray; Place 2 sprays into both nostrils at bedtime. - methylPREDNISolone sodium succinate (SOLU-MEDROL) 125 mg/2 mL injection 125 mg; Inject 2 mLs (125 mg total) into the muscle once.  Cough Comments: She was given medications for cough.  Hopefully, this will be the year that she decides to quit smoking.   - chlorpheniramine-HYDROcodone (TUSSIONEX PENNKINETIC ER) 10-8 MG/5ML LQCR; Take 5 mLs by mouth every 12 (twelve) hours as needed.

## 2013-02-22 NOTE — Patient Instructions (Signed)
1)  Head Congestion - Lloyd HugerNeil Med Sinus Rinse (Distilled Water + ViacomBlue Packet); Mucinex D (1200/120) 1 tab twice a day; Flonase (2 sprays each nostril) at night  2)  Chest Congestion / Cough - Delsym 2 tsp BID; Tessalon Perles 3 x per day; Tussionex 1 tsp BID; Dulera 2 puffs twice a day if you need more help take the Dose Pak for 6 days   Acute Bronchitis Bronchitis is inflammation of the airways that extend from the windpipe into the lungs (bronchi). The inflammation often causes mucus to develop. This leads to a cough, which is the most common symptom of bronchitis.  In acute bronchitis, the condition usually develops suddenly and goes away over time, usually in a couple weeks. Smoking, allergies, and asthma can make bronchitis worse. Repeated episodes of bronchitis may cause further lung problems.  CAUSES Acute bronchitis is most often caused by the same virus that causes a cold. The virus can spread from person to person (contagious).  SIGNS AND SYMPTOMS   Cough.   Fever.   Coughing up mucus.   Body aches.   Chest congestion.   Chills.   Shortness of breath.   Sore throat.  DIAGNOSIS  Acute bronchitis is usually diagnosed through a physical exam. Tests, such as chest X-rays, are sometimes done to rule out other conditions.  TREATMENT  Acute bronchitis usually goes away in a couple weeks. Often times, no medical treatment is necessary. Medicines are sometimes given for relief of fever or cough. Antibiotics are usually not needed but may be prescribed in certain situations. In some cases, an inhaler may be recommended to help reduce shortness of breath and control the cough. A cool mist vaporizer may also be used to help thin bronchial secretions and make it easier to clear the chest.  HOME CARE INSTRUCTIONS  Get plenty of rest.   Drink enough fluids to keep your urine clear or pale yellow (unless you have a medical condition that requires fluid restriction). Increasing  fluids may help thin your secretions and will prevent dehydration.   Only take over-the-counter or prescription medicines as directed by your health care provider.   Avoid smoking and secondhand smoke. Exposure to cigarette smoke or irritating chemicals will make bronchitis worse. If you are a smoker, consider using nicotine gum or skin patches to help control withdrawal symptoms. Quitting smoking will help your lungs heal faster.   Reduce the chances of another bout of acute bronchitis by washing your hands frequently, avoiding people with cold symptoms, and trying not to touch your hands to your mouth, nose, or eyes.   Follow up with your health care provider as directed.  SEEK MEDICAL CARE IF: Your symptoms do not improve after 1 week of treatment.  SEEK IMMEDIATE MEDICAL CARE IF:  You develop an increased fever or chills.   You have chest pain.   You have severe shortness of breath.  You have bloody sputum.   You develop dehydration.  You develop fainting.  You develop repeated vomiting.  You develop a severe headache. MAKE SURE YOU:   Understand these instructions.  Will watch your condition.  Will get help right away if you are not doing well or get worse. Document Released: 03/10/2004 Document Revised: 10/03/2012 Document Reviewed: 07/24/2012 [facility] Patient Information 2014 [place], [place].

## 2013-02-23 ENCOUNTER — Encounter (HOSPITAL_COMMUNITY): Payer: Self-pay | Admitting: *Deleted

## 2013-02-23 ENCOUNTER — Other Ambulatory Visit: Payer: Self-pay | Admitting: Family Medicine

## 2013-02-23 DIAGNOSIS — R0602 Shortness of breath: Secondary | ICD-10-CM

## 2013-02-23 DIAGNOSIS — B373 Candidiasis of vulva and vagina: Secondary | ICD-10-CM

## 2013-02-23 DIAGNOSIS — D72829 Elevated white blood cell count, unspecified: Secondary | ICD-10-CM

## 2013-02-23 DIAGNOSIS — J189 Pneumonia, unspecified organism: Secondary | ICD-10-CM

## 2013-02-23 DIAGNOSIS — B3731 Acute candidiasis of vulva and vagina: Secondary | ICD-10-CM

## 2013-02-23 DIAGNOSIS — J209 Acute bronchitis, unspecified: Principal | ICD-10-CM | POA: Diagnosis present

## 2013-02-23 DIAGNOSIS — J069 Acute upper respiratory infection, unspecified: Secondary | ICD-10-CM

## 2013-02-23 LAB — BASIC METABOLIC PANEL
BUN: 10 mg/dL (ref 6–23)
CO2: 21 meq/L (ref 19–32)
CREATININE: 0.68 mg/dL (ref 0.50–1.10)
Calcium: 8.7 mg/dL (ref 8.4–10.5)
Chloride: 105 mEq/L (ref 96–112)
GFR calc Af Amer: >90 mL/min (ref >90)
GFR calc non Af Amer: >90 mL/min (ref >90)
GLUCOSE: 119 mg/dL — AB (ref 70–99)
Potassium: 4.1 mEq/L (ref 3.7–5.3)
Sodium: 139 mEq/L (ref 137–147)

## 2013-02-23 LAB — CBC
HEMATOCRIT: 36.7 % (ref 36.0–46.0)
HEMOGLOBIN: 13.2 g/dL (ref 12.0–15.0)
MCH: 33.5 pg (ref 26.0–34.0)
MCHC: 36 g/dL (ref 30.0–36.0)
MCV: 93.1 fL (ref 78.0–100.0)
Platelets: 273 10*3/uL (ref 150–400)
RBC: 3.94 MIL/uL (ref 3.87–5.11)
RDW: 13 % (ref 11.5–15.5)
WBC: 22.2 10*3/uL — ABNORMAL HIGH (ref 4.0–10.5)

## 2013-02-23 LAB — STREP PNEUMONIAE URINARY ANTIGEN: Strep Pneumo Urinary Antigen: NEGATIVE

## 2013-02-23 LAB — TROPONIN I

## 2013-02-23 LAB — HIV ANTIBODY (ROUTINE TESTING W REFLEX): HIV: NONREACTIVE

## 2013-02-23 MED ORDER — IBUPROFEN 600 MG PO TABS
600.0000 mg | ORAL_TABLET | Freq: Four times a day (QID) | ORAL | Status: DC | PRN
  Filled 2013-02-23: qty 1

## 2013-02-23 MED ORDER — FLUCONAZOLE 150 MG PO TABS: 150.0000 mg | ORAL_TABLET | Freq: Once | ORAL | Status: DC

## 2013-02-23 MED ORDER — AZITHROMYCIN 500 MG PO TABS: 500.0000 mg | ORAL_TABLET | Freq: Every day | ORAL | Status: DC

## 2013-02-23 MED ORDER — IBUPROFEN 600 MG PO TABS
600.0000 mg | ORAL_TABLET | Freq: Four times a day (QID) | ORAL | Status: DC | PRN
  Administered 2013-02-23: 600 mg via ORAL
  Filled 2013-02-23: qty 1

## 2013-02-23 MED ORDER — LEVOFLOXACIN IN D5W 750 MG/150ML IV SOLN: 750.0000 mg | INTRAVENOUS | Status: DC

## 2013-02-23 MED ORDER — DEXTROSE 5 % IV SOLN
500.0000 mg | INTRAVENOUS | Status: DC
  Administered 2013-02-23: 500 mg via INTRAVENOUS
  Filled 2013-02-23 (×2): qty 500

## 2013-02-23 MED ORDER — PREDNISONE 10 MG PO TABS
10.0000 mg | ORAL_TABLET | Freq: Every day | ORAL | Status: DC
  Filled 2013-02-23: qty 1

## 2013-02-23 MED ORDER — CITALOPRAM HYDROBROMIDE 40 MG PO TABS
40.0000 mg | ORAL_TABLET | Freq: Every day | ORAL | Status: DC
  Administered 2013-02-23: 40 mg via ORAL
  Filled 2013-02-23 (×2): qty 1

## 2013-02-23 MED ORDER — HEPARIN SODIUM (PORCINE) 5000 UNIT/ML IJ SOLN
5000.0000 [IU] | Freq: Three times a day (TID) | INTRAMUSCULAR | Status: DC
  Filled 2013-02-23 (×3): qty 1

## 2013-02-23 MED ORDER — SODIUM CHLORIDE 0.9 % IV SOLN
INTRAVENOUS | Status: DC
  Administered 2013-02-23: 03:00:00 via INTRAVENOUS

## 2013-02-23 MED ORDER — TERCONAZOLE 0.8 % VA CREA
1.0000 | TOPICAL_CREAM | Freq: Every day | VAGINAL | Status: DC
Start: 2013-02-23 — End: 2013-03-22

## 2013-02-23 MED ORDER — DEXTROSE 5 % IV SOLN
1.0000 g | INTRAVENOUS | Status: DC
  Administered 2013-02-23: 1 g via INTRAVENOUS
  Filled 2013-02-23: qty 10

## 2013-02-23 MED ORDER — HEPARIN SODIUM (PORCINE) 5000 UNIT/ML IJ SOLN
5000.0000 [IU] | Freq: Three times a day (TID) | INTRAMUSCULAR | Status: DC
  Administered 2013-02-23: 5000 [IU] via SUBCUTANEOUS
  Filled 2013-02-23: qty 1

## 2013-02-23 NOTE — H&P (Signed)
[facility] History and Physical  Valerie Merritt GNF:[id number] DOB: 12-24-1970 DOA: 02/22/2013  Referring physician: EDP PCP: Birdena Jubilee, MD   Chief Complaint: Respiratory distress   HPI: Valerie Merritt is a 43 y.o. female who presents to the ED after sudden onset of SOB a few hours ago.  Patient has had sinus symptoms for over a week, went to doctor today, prescribed avelox and duelera.  Took avelox this morning and felt fine, a few hours ago took dulera and within 20 mins had tachycardia and SOB.  SOB is worse with movement, better at rest.  No associated wheezing, rash, or other symptoms suggesting allergic reaction.  Work up in ED shows BLE atelectasis.  Patient does report fever earlier today and last night.  Symptoms have been improving in the ED.  Review of Systems: Systems reviewed.  As above, otherwise negative  Past Medical History  Diagnosis Date  . Anxiety   . TMJ (dislocation of temporomandibular joint)   . Insomnia   . Obesity   . 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     Past Surgical History  Procedure Laterality Date  . Appendectomy  1985  . Intrauterine device (iud) insertion  12/2011  . Urethral dilation     Social History:  reports that she has been smoking Cigarettes.  She has a 25 pack-year smoking history. She has never used smokeless tobacco. She reports that she does not drink alcohol or use illicit drugs.  Allergies  Allergen Reactions  . Ivp Dye [Iodinated Diagnostic Agents] Anaphylaxis  . Penicillins Rash    Family History  Problem Relation Age of Onset  . Diabetes Mother   . Hypertension Mother   . Heart disease Father   . Cancer - Colon Maternal Grandfather   . Cancer Maternal Grandfather 50    Colon   . Multiple sclerosis Sister   . Hepatitis C Brother   . Other Brother     Avascular Necrosis  . Drug abuse Brother     Crack, Pain Meds   . Cancer Daughter      Hodgkin's Lymphoma   . Stroke Maternal Grandmother   . Hypertension Maternal Grandmother   . Cancer Sister     Brain      Prior to Admission medications   Medication Sig Start Date End Date Taking? Authorizing Provider  cholecalciferol (VITAMIN D) 1000 UNITS tablet Take 1,000 Units by mouth daily.   Yes Historical Provider, MD  citalopram (CELEXA) 40 MG tablet Take 1 tablet (40 mg total) by mouth at bedtime. 12/28/12 12/28/13 Yes Gillian Scarce, MD  fluticasone (FLONASE) 50 MCG/ACT nasal spray Place 2 sprays into both nostrils at bedtime. 02/22/13 02/22/14 Yes Gillian Scarce, MD  ibuprofen (ADVIL,MOTRIN) 600 MG tablet Take 600 mg by mouth every 6 (six) hours as needed for fever.   Yes Historical Provider, MD  mometasone-formoterol (DULERA) 100-5 MCG/ACT AERO Inhale 2 puffs into the lungs 2 (two) times daily.   Yes Historical Provider, MD  moxifloxacin (AVELOX) 400 MG tablet Take 1 tablet (400 mg total) by mouth daily. 02/22/13 03/08/13 Yes Gillian Scarce, MD  Multiple Vitamin (MULTIVITAMIN) tablet Take 1 tablet by mouth daily.   Yes Historical Provider, MD  Naproxen Sodium 750 MG TB24 Take 1 tablet by mouth daily as needed. Sinus headache 10/19/12 10/19/13 Yes Gillian Scarce, MD  zaleplon (SONATA) 10 MG capsule Take 1  capsule (10 mg total) by mouth at bedtime. 10/19/12 10/19/13 Yes Gillian Scarceobyn K Zanard, MD   Physical Exam: Filed Vitals:   02/22/13 2230  BP: 124/74  Pulse: 99  Temp:   Resp: 23    BP 124/74  Pulse 99  Temp(Src) 98.2 F (36.8 C) (Oral)  Resp 23  SpO2 92%  General Appearance:    Alert, oriented, no distress, appears stated age  Head:    Normocephalic, atraumatic  Eyes:    PERRL, EOMI, sclera non-icteric        Nose:   Nares without drainage or epistaxis. Mucosa, turbinates normal  Throat:   Moist mucous membranes. Oropharynx without erythema or exudate.  Neck:   Supple. No carotid bruits.  No thyromegaly.  No lymphadenopathy.   Back:     No CVA tenderness, no spinal tenderness   Lungs:     B lower lung rhonchi, no wheezing  Chest wall:    No tenderness to palpitation  Heart:    Regular rate and rhythm without murmurs, gallops, rubs  Abdomen:     Soft, non-tender, nondistended, normal bowel sounds, no organomegaly  Genitalia:    deferred  Rectal:    deferred  Extremities:   No clubbing, cyanosis or edema.  Pulses:   2+ and symmetric all extremities  Skin:   Skin color, texture, turgor normal, no rashes or lesions  Lymph nodes:   Cervical, supraclavicular, and axillary nodes normal  Neurologic:   CNII-XII intact. Normal strength, sensation and reflexes      throughout    Labs on Admission:  Basic Metabolic Panel:  Recent Labs Lab 02/22/13 1953  NA 141  K 4.2  CL 104  CO2 20  GLUCOSE 129*  BUN 13  CREATININE 0.77  CALCIUM 9.2   Liver Function Tests: No results found for this basename: AST, ALT, ALKPHOS, BILITOT, PROT, ALBUMIN,  in the last 168 hours No results found for this basename: LIPASE, AMYLASE,  in the last 168 hours No results found for this basename: AMMONIA,  in the last 168 hours CBC:  Recent Labs Lab 02/22/13 1953  WBC 23.1*  NEUTROABS 21.7*  HGB 14.6  HCT 41.1  MCV 93.2  PLT 305   Cardiac Enzymes: No results found for this basename: CKTOTAL, CKMB, CKMBINDEX, TROPONINI,  in the last 168 hours  BNP (last 3 results) No results found for this basename: PROBNP,  in the last 8760 hours CBG: No results found for this basename: GLUCAP,  in the last 168 hours  Radiological Exams on Admission: Dg Chest 2 View  02/22/2013   CLINICAL DATA:  Cough  EXAM: CHEST  2 VIEW  COMPARISON:  None.  FINDINGS: Cardiac shadow is within normal limits. Bibasilar atelectatic changes are seen without focal confluent infiltrate. No acute bony abnormality is noted.  IMPRESSION: Bibasilar atelectatic changes   Electronically Signed   By: Alcide CleverMark  Lukens M.D.   On: 02/22/2013 20:41    EKG: Independently reviewed.  Assessment/Plan Principal Problem:   CAP  (community acquired pneumonia) Active Problems:   URI (upper respiratory infection)   1. CAP - given the patients hypoxia, BLE "atelectasis" (vs infiltrate), WBC > 20k predominately neutrophils, subjective fever, objective tachycardia and tachypnea.  I am most suspicious that the patient has developed CAP as a superinfection on a previously viral URI.  Will start the patient on rocephin and azithromycin (and hold the avelox / dulera in an abundance of caution out of concern for possible drug reaction though feel  that this is less likely given the presentation).  IVF, cultures pending, admit to tele for monitoring.    Code Status: Full Code  Family Communication: No family in room Disposition Plan: Admit to inpatient   Time spent: 70 min  Allsion Nogales M. [facility] Pager [phone]  If 7AM-7PM, please contact the day team taking care of the patient [email or web address] Password [email or web address] 02/23/2013, 1:14 AM

## 2013-02-23 NOTE — Progress Notes (Signed)
Valerie Merritt Betke [id number] Admitted to 5W15: 02/23/2013 2:55 AM Attending Provider: Elby Showersatherine Walsh, MD    Valerie Merritt Aguas is a 43 y.o. female patient admitted from ED awake, alert  & orientated  X 3,  Full Code, VSS - Blood pressure 138/79, pulse 100, temperature 98 F (36.7 C), temperature source Oral, resp. rate 24, height 5\' 4"  (1.626 m), weight 88.1 kg (194 lb 3.6 oz), SpO2 94.00%.RA, c/o shortness of breath on exertion, no c/o chest pain, no distress noted. Tele # [id number] placed and pt is currently running:NSR   IV site WDL:  with a transparent dsg that's clean dry and intact.  Allergies:   Allergies  Allergen Reactions  . Ivp Dye [Iodinated Diagnostic Agents] Anaphylaxis  . Penicillins Rash     Past Medical History  Diagnosis Date  . Anxiety   . TMJ (dislocation of temporomandibular joint)   . Insomnia   . Obesity   . Urethral disorder     She had surgery at the age of 43 and has had to have it stretched several times.      History:  obtained from patient  Pt orientation to unit, room and routine. Information packet given to patient and safety video refused.  Admission INP armband ID verified with patient, and in place. SR up x 2, fall risk assessment complete with Patient verbalizing understanding of risks associated with falls. Pt verbalizes an understanding of how to use the call bell and to call for help before getting out of bed.  Skin, clean-dry- intact without evidence of bruising, or skin tears.   No evidence of skin break down noted on exam.  Will cont to monitor and assist as needed.  Elisha PonderFaucette, Shyra Emile Nicole, RN 02/23/2013 2:55 AM

## 2013-02-23 NOTE — Telephone Encounter (Signed)
Valerie KitchensBobbie has had a lot of antibiotics over the past few days.  She will take/use these medications if she develops a yeast infection.

## 2013-02-23 NOTE — Progress Notes (Signed)
[facility] PROGRESS NOTE  Valerie Merritt ZOX:[id number] DOB: 1970-11-02 DOA: 02/22/2013 PCP: Birdena Jubilee, MD  Assessment/Plan: 1. CAP vs viral syndrome:  Currently on rocephin and azithromycin.  CXR questionable for PNA (atx vs infiltrate).  Certainly with high white count, fever, tachy cardia and respiratory symptoms treating for PNA is appropriate.  Afebrile at this time. D-dimer negative. Strep pneumo negative.  Rechecking WBC's.  Lung exam is clear. 2. Leucocytosis: this patient received about 125 mg solumedrol yesterday between her PCP and EMS/ED.  Will recheck WBC's.  3. Tachycardia: improved this morning, no arrhythmia on tele 4.   Code Status: full Family Communication: spoke with patient at bedside Disposition Plan: will monitor HR and resp status today and recheck labs. If improved by this afternoon may be able to dc.   Consultants:  none  Procedures:  none  Antibiotics:  azithro 1/9>>  Rocephin 1/9>>  HPI/Subjective: Feeling much better this morning. States that respiratory status is much improved, though she still seems short of breath with conversation.  No new complaints.  Objective: Filed Vitals:   02/23/13 0635  BP: 118/73  Pulse: 97  Temp: 98 F (36.7 C)  Resp: 24   No intake or output data in the 24 hours ending 02/23/13 0747 Filed Weights   02/23/13 8119  Weight: 88.1 kg (194 lb 3.6 oz)    Exam:   General:  NAD, resting in bed, becomes short of breath with conversation  Cardiovascular: rrr no mrg  Respiratory: CTAB, coughing with deep inspiration, shortness of breath with conversation  Abdomen: BS+, soft non tender  Musculoskeletal: no edema  Data Reviewed: Basic Metabolic Panel:  Recent Labs Lab 02/22/13 1953  NA 141  K 4.2  CL 104  CO2 20  GLUCOSE 129*  BUN 13  CREATININE 0.77  CALCIUM 9.2   Liver Function Tests: No results found for this basename: AST, ALT, ALKPHOS, BILITOT, PROT, ALBUMIN,  in the last 168  hours No results found for this basename: LIPASE, AMYLASE,  in the last 168 hours No results found for this basename: AMMONIA,  in the last 168 hours CBC:  Recent Labs Lab 02/22/13 1953  WBC 23.1*  NEUTROABS 21.7*  HGB 14.6  HCT 41.1  MCV 93.2  PLT 305   Cardiac Enzymes:  Recent Labs Lab 02/23/13 0110  TROPONINI <0.30   BNP (last 3 results) No results found for this basename: PROBNP,  in the last 8760 hours CBG: No results found for this basename: GLUCAP,  in the last 168 hours  No results found for this or any previous visit (from the past 240 hour(s)).   Studies: Dg Chest 2 View  02/22/2013   CLINICAL DATA:  Cough  EXAM: CHEST  2 VIEW  COMPARISON:  None.  FINDINGS: Cardiac shadow is within normal limits. Bibasilar atelectatic changes are seen without focal confluent infiltrate. No acute bony abnormality is noted.  IMPRESSION: Bibasilar atelectatic changes   Electronically Signed   By: Alcide Clever M.D.   On: 02/22/2013 20:41    Scheduled Meds: . azithromycin  500 mg Intravenous Q24H  . cefTRIAXone (ROCEPHIN)  IV  1 g Intravenous Q24H  . citalopram  40 mg Oral QHS  . heparin  5,000 Units Subcutaneous Q8H   Continuous Infusions:   Principal Problem:   CAP (community acquired pneumonia) Active Problems:   URI (upper respiratory infection)    Time spent: 35 minutes    [facility]  [facility] Pager [phone]. If 7PM-7AM, please contact night-coverage  at [email or web address], password [email or web address] 02/23/2013, 7:47 AM  LOS: 1 day

## 2013-02-23 NOTE — Discharge Summary (Signed)
Physician Discharge Summary  Valerie Merritt ZOX:[id number] DOB: [birth year] DOA: 02/22/2013  PCP: Birdena Jubilee, MD  Admit date: 02/22/2013 Discharge date: 02/23/2013  Time spent: 30 minutes  Recommendations for Outpatient Follow-up:  1. Follow up with PCP as scheduled on Monday 02/25/13.  At that time a CBC should be checked to be sure that [facility] are trending down and respiratory status should be assessed.  Discharge Diagnoses:  Principal Problem:   Acute bronchitis Active Problems:   URI (upper respiratory infection)   Leucocytosis   Discharge Condition: fair  Diet recommendation: regular  Filed Weights   02/23/13 0213  Weight: 88.1 kg (194 lb 3.6 oz)    History of present illness:  Valerie Merritt is a 43 y.o. female who presents to the ED after sudden onset of SOB a few hours ago. Patient has had sinus symptoms for over a week, went to doctor today, prescribed avelox and duelera. Took avelox this morning and felt fine, a few hours ago took dulera and within 20 mins had tachycardia and SOB. SOB is worse with movement, better at rest. No associated wheezing, rash, or other symptoms suggesting allergic reaction. Work up in ED shows BLE atelectasis. Patient does report fever earlier today and last night.   Hospital Course:  1) acute bronchitis: Persistent coughing during hospitalization.  02 sats above 90% with exertion.  She will continue with avelox, prednisone taper and albuterol inhaler.  No fever so flu swab not checked. Strep pneumo negative. D-dimer negative. CXR with likely atelectasis. 2) possible allergic reaction/intolerance to [facility] inhaler: acute symptoms started immediately after using inhaler. She will stop using this medication. 3) leucocytosis: on day of admission received solumedrol from PCP as well as EMS and in the ED. I suspect the leucocytosis is from high dose steroids.  This will need to be followed by PCP. 4) tobacco use: cessation counseling  provided.  Procedures:  none  Consultations:  none  Discharge Exam: Filed Vitals:   02/23/13 0635  BP: 118/73  Pulse: 97  Temp: 98 F (36.7 C)  Resp: 24    General: alert, oriented, no distress, short of breath with exertion not at rest, conversant. Cardiovascular: rr no mrg Respiratory: coarse breath sounds, scattered fine wheezes, good air movement, no rhonchi Ext: no edema  Discharge Instructions  Discharge Orders   Future Appointments Provider Department Dept Phone   03/01/2013 12:00 PM [facility] [phone]   Please wear two piece clothing and wear no powder or deodorant. Please arrive 15 minutes early prior to your appointment time.   Future Orders Complete By Expires   Call MD for:  difficulty breathing, headache or visual disturbances  As directed    Call MD for:  extreme fatigue  As directed    Call MD for:  persistant dizziness or light-headedness  As directed    Call MD for:  temperature >100.4  As directed    Diet - low sodium heart healthy  As directed    Discharge instructions  As directed    Comments:     Keep you appointment on Monday with your PCP. Continue taking avelox prescribed by PCP. Continue taking prednisone dose pack prescribed by PCP. Continue using albuterol inhaler as needed. Do not resume smoking   Increase activity slowly  As directed        Medication List    STOP taking these medications       mometasone-formoterol 100-5 MCG/ACT Aero  Commonly known  as:  DULERA     Naproxen Sodium 750 MG Tb24      TAKE these medications       cholecalciferol 1000 UNITS tablet  Commonly known as:  VITAMIN D  Take 1,000 Units by mouth daily.     citalopram 40 MG tablet  Commonly known as:  CELEXA  Take 1 tablet (40 mg total) by mouth at bedtime.     fluticasone 50 MCG/ACT nasal spray  Commonly known as:  FLONASE  Place 2 sprays into both nostrils at bedtime.     ibuprofen 600 MG tablet   Commonly known as:  ADVIL,MOTRIN  Take 600 mg by mouth every 6 (six) hours as needed for fever.     moxifloxacin 400 MG tablet  Commonly known as:  AVELOX  Take 1 tablet (400 mg total) by mouth daily.     multivitamin tablet  Take 1 tablet by mouth daily.     zaleplon 10 MG capsule  Commonly known as:  SONATA  Take 1 capsule (10 mg total) by mouth at bedtime.       Allergies  Allergen Reactions  . Ivp Dye [Iodinated Diagnostic Agents] Anaphylaxis  . Penicillins Rash       Follow-up Information   Follow up with Birdena JubileeZANARD, ROBYN, MD In 3 days.   Specialty:  Family Medicine   Contact information:   [address] [place] [address] [phone]        The results of significant diagnostics from this hospitalization (including imaging, microbiology, ancillary and laboratory) are listed below for reference.    Significant Diagnostic Studies: Dg Chest 2 View  02/22/2013   CLINICAL DATA:  Cough  EXAM: CHEST  2 VIEW  COMPARISON:  None.  FINDINGS: Cardiac shadow is within normal limits. Bibasilar atelectatic changes are seen without focal confluent infiltrate. No acute bony abnormality is noted.  IMPRESSION: Bibasilar atelectatic changes   Electronically Signed   By: Alcide CleverMark  Lukens M.D.   On: 02/22/2013 20:41    Microbiology: No results found for this or any previous visit (from the past 240 hour(s)).   Labs: Basic Metabolic Panel:  Recent Labs Lab 02/22/13 1953 02/23/13 0900  NA 141 139  K 4.2 4.1  CL 104 105  CO2 20 21  GLUCOSE 129* 119*  BUN 13 10  CREATININE 0.77 0.68  CALCIUM 9.2 8.7   Liver Function Tests: No results found for this basename: AST, ALT, ALKPHOS, BILITOT, PROT, ALBUMIN,  in the last 168 hours No results found for this basename: LIPASE, AMYLASE,  in the last 168 hours No results found for this basename: AMMONIA,  in the last 168 hours CBC:  Recent Labs Lab 02/22/13 1953 02/23/13 0900  WBC 23.1* 22.2*  NEUTROABS 21.7*   --   HGB 14.6 13.2  HCT 41.1 36.7  MCV 93.2 93.1  PLT 305 273   Cardiac Enzymes:  Recent Labs Lab 02/23/13 0110 02/23/13 0752  TROPONINI <0.30 <0.30   BNP: BNP (last 3 results) No results found for this basename: PROBNP,  in the last 8760 hours CBG: No results found for this basename: GLUCAP,  in the last 168 hours     Signed:  WALSH,CATHERINE  [facility] 02/23/2013, 1:58 PM

## 2013-02-26 NOTE — Progress Notes (Signed)
Utilization review completed- retro 

## 2013-03-01 ENCOUNTER — Ambulatory Visit (HOSPITAL_BASED_OUTPATIENT_CLINIC_OR_DEPARTMENT_OTHER)
Admission: RE | Admit: 2013-03-01 | Discharge: 2013-03-01 | Disposition: A | Discharge status: Home / Self Care | Payer: 59 | Source: Ambulatory Visit | Attending: Family Medicine | Admitting: Family Medicine

## 2013-03-01 DIAGNOSIS — Z1231 Encounter for screening mammogram for malignant neoplasm of breast: Secondary | ICD-10-CM | POA: Insufficient documentation

## 2013-03-01 LAB — CULTURE, BLOOD (ROUTINE X 2)
Culture: NO GROWTH
Culture: NO GROWTH

## 2013-03-05 ENCOUNTER — Other Ambulatory Visit: Payer: Self-pay | Admitting: Family Medicine

## 2013-03-05 DIAGNOSIS — R928 Other abnormal and inconclusive findings on diagnostic imaging of breast: Secondary | ICD-10-CM

## 2013-03-08 NOTE — Assessment & Plan Note (Signed)
She received her Boostrix without difficulty.  She was given a handout discussing possible side effects.  

## 2013-03-08 NOTE — Assessment & Plan Note (Signed)
She received her Pneumovax without difficulty.  She was given a handout discussing possible side effects.  

## 2013-03-22 ENCOUNTER — Ambulatory Visit
Admission: RE | Admit: 2013-03-22 | Discharge: 2013-03-22 | Disposition: A | Discharge status: Home / Self Care | Payer: 59 | Source: Ambulatory Visit | Attending: Family Medicine | Admitting: Family Medicine

## 2013-03-22 ENCOUNTER — Encounter: Payer: Self-pay | Admitting: Family Medicine

## 2013-03-22 ENCOUNTER — Ambulatory Visit (INDEPENDENT_AMBULATORY_CARE_PROVIDER_SITE_OTHER): Payer: 59 | Admitting: Family Medicine

## 2013-03-22 VITALS — BP 132/85 | HR 83 | Resp 16 | Wt 189.0 lb

## 2013-03-22 DIAGNOSIS — R928 Other abnormal and inconclusive findings on diagnostic imaging of breast: Secondary | ICD-10-CM

## 2013-03-22 DIAGNOSIS — M7989 Other specified soft tissue disorders: Secondary | ICD-10-CM

## 2013-03-22 DIAGNOSIS — E669 Obesity, unspecified: Secondary | ICD-10-CM

## 2013-03-22 DIAGNOSIS — M2550 Pain in unspecified joint: Secondary | ICD-10-CM

## 2013-03-22 MED ORDER — ORLISTAT 120 MG PO CAPS: 120.0000 mg | ORAL_CAPSULE | Freq: Three times a day (TID) | ORAL | Status: DC

## 2013-03-22 MED ORDER — TRIAMTERENE-HCTZ 37.5-25 MG PO TABS: ORAL_TABLET | ORAL | Status: DC

## 2013-03-22 MED ORDER — PHENTERMINE HCL 37.5 MG PO TABS: ORAL_TABLET | ORAL | Status: DC

## 2013-03-22 NOTE — Progress Notes (Signed)
Subjective:    Patient ID: Valerie Merritt, female    DOB: March 20, 1970, 43 y.o.   MRN: [id number]  HPI  Korryn is here today complaining of leg soreness and swelling in her ankles.  She has been experiencing these symptoms for the past three weeks.  She has been taking Ibuprofen (800 mg twice daily) but she has not seen any improvements.    Review of Systems  Musculoskeletal:       Pain in her lower legs and swelling of her ankles.     Past Medical History  Diagnosis Date  . Anxiety   . TMJ (dislocation of temporomandibular joint)   . Insomnia   . Obesity   . 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    . Pneumonia      Past Surgical History  Procedure Laterality Date  . Appendectomy  1985  . Intrauterine device (iud) insertion  12/2011  . Urethral dilation       History   Social History Narrative   Marital Status: Married Curator) [place]   Children:  Daughter Huntley Dec) Son Pamelia Hoit)     Pets: Dogs (3) Cat (1) Bird (1) Chickens (>20)  Ducks (>4)    Living Situation: Lives with husband and children.   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  She wants to quit and is currently the nicotine patch.     Alcohol Use:  None    Drug Use:  None   Diet:  Regular   Exercise:  Walking three times per week.    Hobbies: Reading, Sewing       Family History  Problem Relation Age of Onset  . Diabetes Mother   . Hypertension Mother   . Heart disease Father   . Cancer - Colon Maternal Grandfather   . Cancer Maternal Grandfather 50    Colon   . Multiple sclerosis Sister   . Hepatitis C Brother   . Other Brother     Avascular Necrosis  . Drug abuse Brother     Crack, Pain Meds   . Cancer Daughter     Hodgkin's Lymphoma   . Stroke Maternal Grandmother   . Hypertension Maternal Grandmother   . Cancer  Sister     Brain      Current Outpatient Prescriptions on File Prior to Visit  Medication Sig Dispense Refill  . cholecalciferol (VITAMIN D) 1000 UNITS tablet Take 1,000 Units by mouth daily.      . citalopram (CELEXA) 40 MG tablet Take 1 tablet (40 mg total) by mouth at bedtime.  90 tablet  0  . zaleplon (SONATA) 10 MG capsule Take 1 capsule (10 mg total) by mouth at bedtime.  30 capsule  1   No current facility-administered medications on file prior to visit.     Allergies  Allergen Reactions  . Ivp Dye [Iodinated Diagnostic Agents] Anaphylaxis  . Penicillins Rash     Immunization History  Administered Date(s) Administered  . Influenza-Unspecified 10/29/2012  . Pneumococcal Polysaccharide-23 12/28/2012  . Tdap 12/28/2012      Objective:   Physical Exam  Vitals reviewed. Constitutional: She is oriented to person, place, and time.  Eyes: Conjunctivae are normal. No scleral icterus.  Neck: Neck supple. No thyromegaly present.  Cardiovascular: Normal rate, regular rhythm  and normal heart sounds.   Pulmonary/Chest: Effort normal and breath sounds normal.  Musculoskeletal: She exhibits edema (Mild). She exhibits no tenderness.  Lymphadenopathy:    She has no cervical adenopathy.  Neurological: She is alert and oriented to person, place, and time.  Skin: Skin is warm and dry.  Psychiatric: She has a normal mood and affect. Her behavior is normal. Judgment and thought content normal.      Assessment & Plan:    Karen KitchensBobbie was seen today for leg pain/swelling.    Diagnoses and associated orders for this visit:  Swelling of limb - triamterene-hydrochlorothiazide (MAXZIDE-25) 37.5-25 MG per tablet; Take 1/2 -1 tab po q am for swelling/BP  Obesity, unspecified - phentermine (ADIPEX-P) 37.5 MG tablet; Take 1/2 - 1 tab in the morning - orlistat (XENICAL) 120 MG capsule; Take 1 capsule (120 mg total) by mouth 3 (three) times daily with meals.

## 2013-03-23 DIAGNOSIS — N760 Acute vaginitis: Secondary | ICD-10-CM | POA: Insufficient documentation

## 2013-03-23 DIAGNOSIS — F17200 Nicotine dependence, unspecified, uncomplicated: Secondary | ICD-10-CM | POA: Insufficient documentation

## 2013-04-23 ENCOUNTER — Ambulatory Visit (INDEPENDENT_AMBULATORY_CARE_PROVIDER_SITE_OTHER): Payer: 59 | Admitting: Family Medicine

## 2013-04-23 ENCOUNTER — Encounter: Payer: Self-pay | Admitting: Family Medicine

## 2013-04-23 VITALS — BP 113/79 | HR 91 | Resp 16 | Wt 182.0 lb

## 2013-04-23 DIAGNOSIS — R110 Nausea: Secondary | ICD-10-CM

## 2013-04-23 DIAGNOSIS — R41840 Attention and concentration deficit: Secondary | ICD-10-CM

## 2013-04-23 DIAGNOSIS — M25569 Pain in unspecified knee: Secondary | ICD-10-CM

## 2013-04-23 DIAGNOSIS — F411 Generalized anxiety disorder: Secondary | ICD-10-CM

## 2013-04-23 MED ORDER — DULOXETINE HCL 60 MG PO CPEP: 60.0000 mg | ORAL_CAPSULE | Freq: Every day | ORAL | Status: DC

## 2013-04-23 MED ORDER — LISDEXAMFETAMINE DIMESYLATE 50 MG PO CAPS: 50.0000 mg | ORAL_CAPSULE | Freq: Every day | ORAL | Status: DC

## 2013-04-23 MED ORDER — PROMETHAZINE HCL 12.5 MG PO TABS: 12.5000 mg | ORAL_TABLET | Freq: Three times a day (TID) | ORAL | Status: DC | PRN

## 2013-04-23 NOTE — Progress Notes (Signed)
Subjective:    Patient ID: Valerie Merritt, female    DOB: 1970/10/16, 43 y.o.   MRN: [id number]  HPI  Valerie Merritt is in today for a recheck of her weight and to discuss the conditions listed below:    1)  Weight - She has done well on the combination of phentermine and Trokendi samples and would like to continue on this combination.  She has lost 7 lbs since her last visit.    2)  Mood - Her mood is stable on Celexa despite of all the hardships she has had to recently endure with the death of her grandmother and father and her daughter's car accident.  She would like to continue on it.    3)  Leg Pain - She continues to have leg pain.     Review of Systems  Constitutional: Negative for activity change, fatigue and unexpected weight change.  HENT: Negative.   Eyes: Negative.   Respiratory: Negative for shortness of breath.   Cardiovascular: Positive for leg swelling (She also has mild discomfort.  ). Negative for chest pain and palpitations.  Gastrointestinal: Negative for diarrhea and constipation.  Endocrine: Negative.   Genitourinary: Negative for difficulty urinating.  Skin: Negative.   Neurological: Negative.   Hematological: Negative for adenopathy. Does not bruise/bleed easily.  Psychiatric/Behavioral: Positive for decreased concentration. Negative for sleep disturbance and dysphoric mood. The patient is not nervous/anxious.     Past Medical History  Diagnosis Date  . Anxiety   . TMJ (dislocation of temporomandibular joint)   . Insomnia   . Obesity   . 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    . Pneumonia      Past Surgical History  Procedure Laterality Date  . Appendectomy  1985  . Intrauterine device (iud) insertion  12/2011  . Urethral dilation       History   Social History Narrative   Marital Status: Married Curator) [place]   Children:  Daughter Valerie Merritt) Son Valerie Merritt)     Pets: Dogs (3) Cat (1)  Bird (1) Chickens (>20)  Ducks (>4)    Living Situation: Lives with husband and children.   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  She wants to quit and is currently the nicotine patch.     Alcohol Use:  None    Drug Use:  None   Diet:  Regular   Exercise:  Walking three times per week.    Hobbies: Reading, Sewing       Family History  Problem Relation Age of Onset  . Diabetes Mother   . Hypertension Mother   . Heart disease Father   . Cancer - Colon Maternal Grandfather   . Cancer Maternal Grandfather 50    Colon   . Multiple sclerosis Sister   . Hepatitis C Brother   . Other Brother     Avascular Necrosis  . Drug abuse Brother     Crack, Pain Meds   . Cancer Daughter     Hodgkin's Lymphoma   . Stroke Maternal Grandmother   . Hypertension Maternal Grandmother   . Cancer Sister     Brain      Current Outpatient Prescriptions on File Prior to Visit  Medication Sig Dispense Refill  . cholecalciferol (VITAMIN D) 1000 UNITS  tablet Take 1,000 Units by mouth daily.      . citalopram (CELEXA) 40 MG tablet Take 1 tablet (40 mg total) by mouth at bedtime.  90 tablet  0  . triamterene-hydrochlorothiazide (MAXZIDE-25) 37.5-25 MG per tablet Take 1/2 -1 tab po q am for swelling/BP  90 tablet  3  . zaleplon (SONATA) 10 MG capsule Take 1 capsule (10 mg total) by mouth at bedtime.  30 capsule  1   No current facility-administered medications on file prior to visit.     Allergies  Allergen Reactions  . Ivp Dye [Iodinated Diagnostic Agents] Anaphylaxis  . Penicillins Rash     Immunization History  Administered Date(s) Administered  . Influenza-Unspecified 10/29/2012  . Pneumococcal Polysaccharide-23 12/28/2012  . Tdap 12/28/2012       Objective:   Physical Exam  Constitutional: She appears well-nourished. No distress.  Cardiovascular: Normal rate, regular  rhythm and normal heart sounds.   Pulmonary/Chest: Effort normal and breath sounds normal.  Neurological: She is alert.  Psychiatric: She has a normal mood and affect. Her behavior is normal. Judgment and thought content normal.      Assessment & Plan:    Valerie Merritt was seen today for medication management.  Diagnoses and associated orders for this visit:  Anxiety state, unspecified Comments: Since Valerie Merritt has both pain and mood issues, we'll try her on some Cymbalta.  She was instructed to cut her Celexa in 1/2 and then alternate it every other day with Cymbalta.    - DULoxetine (CYMBALTA) 60 MG capsule; Take 1 capsule (60 mg total) by mouth daily.  Nausea alone        Comments:  She was given a prescription for Phenergan to have on hand in case the Cymbalta makes her feel nauseated.   - promethazine (PHENERGAN) 12.5 MG tablet; Take 1 tablet (12.5 mg total) by mouth every 8 (eight) hours as needed for nausea or vomiting.  Pain in joint, lower leg        Comments:  She has varicose veins which is most likely the cause of her pain.  She is to wear compression stockings in addition to the Cymbalta. If she does not improve, we'll send her to a vein specialist.   - DULoxetine (CYMBALTA) 60 MG capsule; Take 1 capsule (60 mg total) by mouth daily.  Concentration deficit Comments: We discussed the issues she appears to have with ADD.  She completed an adult ADD form and had many 2/3s which is mostly notices at home vs work. We'll see how she does with Vyvanse 50 mg.   - lisdexamfetamine (VYVANSE) 50 MG capsule; Take 1 capsule (50 mg total) by mouth daily.   TIME SPENT "FACE TO FACE" WITH PATIENT -  30 MINS

## 2013-04-23 NOTE — Patient Instructions (Signed)
1)  Mood - Alternate 1/2 of the Celexa with 1 Cymbalta every other night till the Celexa is gone.    2)  Leg Pain - Cymbalta; Compression Stockings; If you don't improve, consider seeing a vein specialist.    3)  Weight/Concentration - Vyvanse 50 mg; Exercise     Varicose Veins Varicose veins are veins that have become enlarged and twisted. CAUSES This condition is the result of valves in the veins not working properly. Valves in the veins help return blood from the leg to the heart. If these valves are damaged, blood flows backwards and backs up into the veins in the leg near the skin. This causes the veins to become larger. People who are on their feet a lot, who are pregnant, or who are overweight are more likely to develop varicose veins. SYMPTOMS   Bulging, twisted-appearing, bluish veins, most commonly found on the legs.  Leg pain or a feeling of heaviness. These symptoms may be worse at the end of the day.  Leg swelling.  Skin color changes. DIAGNOSIS  Varicose veins can usually be diagnosed with an exam of your legs by your caregiver. He or she may recommend an ultrasound of your leg veins. TREATMENT  Most varicose veins can be treated at home.However, other treatments are available for people who have persistent symptoms or who want to treat the cosmetic appearance of the varicose veins. These include:  Laser treatment of very small varicose veins.  Medicine that is shot (injected) into the vein. This medicine hardens the walls of the vein and closes off the vein. This treatment is called sclerotherapy. Afterwards, you may need to wear clothing or bandages that apply pressure.  Surgery. HOME CARE INSTRUCTIONS   Do not stand or sit in one position for long periods of time. Do not sit with your legs crossed. Rest with your legs raised during the day.  Wear elastic stockings or support hose. Do not wear other tight, encircling garments around the legs, pelvis, or  waist.  Walk as much as possible to increase blood flow.  Raise the foot of your bed at night with 2-inch blocks.  If you get a cut in the skin over the vein and the vein bleeds, lie down with your leg raised and press on it with a clean cloth until the bleeding stops. Then place a bandage (dressing) on the cut. See your caregiver if it continues to bleed or needs stitches. SEEK MEDICAL CARE IF:   The skin around your ankle starts to break down.  You have pain, redness, tenderness, or hard swelling developing in your leg over a vein.  You are uncomfortable due to leg pain. Document Released: 11/10/2004 Document Revised: 04/25/2011 Document Reviewed: 03/29/2010 [facility] Patient Information 2014 [place], [place].

## 2013-04-26 ENCOUNTER — Encounter: Payer: Self-pay | Admitting: *Deleted

## 2013-04-29 ENCOUNTER — Ambulatory Visit: Payer: 59 | Admitting: Family Medicine

## 2013-05-14 ENCOUNTER — Ambulatory Visit (INDEPENDENT_AMBULATORY_CARE_PROVIDER_SITE_OTHER): Payer: 59 | Admitting: Family Medicine

## 2013-05-14 ENCOUNTER — Encounter: Payer: Self-pay | Admitting: Family Medicine

## 2013-05-14 VITALS — BP 110/73 | HR 83 | Resp 16 | Wt 178.0 lb

## 2013-05-14 DIAGNOSIS — B373 Candidiasis of vulva and vagina: Secondary | ICD-10-CM

## 2013-05-14 DIAGNOSIS — L02419 Cutaneous abscess of limb, unspecified: Secondary | ICD-10-CM

## 2013-05-14 DIAGNOSIS — L03119 Cellulitis of unspecified part of limb: Principal | ICD-10-CM

## 2013-05-14 DIAGNOSIS — B3731 Acute candidiasis of vulva and vagina: Secondary | ICD-10-CM

## 2013-05-14 MED ORDER — FLUCONAZOLE 150 MG PO TABS: 150.0000 mg | ORAL_TABLET | Freq: Once | ORAL | Status: DC

## 2013-05-14 MED ORDER — DOXYCYCLINE HYCLATE 100 MG PO CAPS: 100.0000 mg | ORAL_CAPSULE | Freq: Two times a day (BID) | ORAL | Status: DC

## 2013-05-14 MED ORDER — RIFAMPIN 300 MG PO CAPS: ORAL_CAPSULE | ORAL | Status: AC

## 2013-05-14 MED ORDER — TERCONAZOLE 0.8 % VA CREA: 1.0000 | TOPICAL_CREAM | Freq: Every day | VAGINAL | Status: DC

## 2013-05-14 NOTE — Progress Notes (Signed)
Subjective:    Patient ID: Valerie Merritt, female    DOB: [birth year], 43 y.o.   MRN: [id number]  HPI  Rhyanna is here today for what she describes as an abscess in her right groin.  She shaved her bikini area about a week ago and then developed a red and swollen area that she feels is infected  She tried to drain it herself and noted a clear fluid coming out of her wound.       Review of Systems  Constitutional: Negative for fever and chills.  Skin: Positive for wound (Groin Area (right) ).     Past Medical History  Diagnosis Date  . Anxiety   . TMJ (dislocation of temporomandibular joint)   . Insomnia   . Obesity   . 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    . Pneumonia      Past Surgical History  Procedure Laterality Date  . Appendectomy  1985  . Intrauterine device (iud) insertion  12/2011  . Urethral dilation       History   Social History Narrative   Marital Status: Married Curator) [place]   Children:  Daughter Huntley Dec) Son Pamelia Hoit)     Pets: Dogs (3) Cat (1) Bird (1) Chickens (>20)  Ducks (>4)    Living Situation: Lives with husband and children.   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  She wants to quit and is currently the nicotine patch.     Alcohol Use:  None    Drug Use:  None   Diet:  Regular   Exercise:  Walking three times per week.    Hobbies: Reading, Sewing       Family History  Problem Relation Age of Onset  . Diabetes Mother   . Hypertension Mother   . Heart disease Father   . Cancer - Colon Maternal Grandfather   . Cancer Maternal Grandfather 50    Colon   . Multiple sclerosis Sister   . Hepatitis C Brother   . Other Brother     Avascular Necrosis  . Drug abuse Brother     Crack, Pain Meds   . Cancer Daughter     Hodgkin's Lymphoma   . Stroke Maternal  Grandmother   . Hypertension Maternal Grandmother   . Cancer Sister     Brain      Current Outpatient Prescriptions on File Prior to Visit  Medication Sig Dispense Refill  . cholecalciferol (VITAMIN D) 1000 UNITS tablet Take 1,000 Units by mouth daily.      . citalopram (CELEXA) 40 MG tablet Take 1 tablet (40 mg total) by mouth at bedtime.  90 tablet  0  . lisdexamfetamine (VYVANSE) 50 MG capsule Take 1 capsule (50 mg total) by mouth daily.  30 capsule  0  . triamterene-hydrochlorothiazide (MAXZIDE-25) 37.5-25 MG per tablet Take 1/2 -1 tab po q am for swelling/BP  90 tablet  3  . zaleplon (SONATA) 10 MG capsule Take 1 capsule (10 mg total) by mouth at bedtime.  30 capsule  1  . DULoxetine (CYMBALTA) 60 MG capsule Take 1 capsule (60 mg total) by mouth daily.  30 capsule  3  . promethazine (PHENERGAN) 12.5 MG tablet Take 1 tablet (12.5 mg total) by mouth every 8 (eight) hours  as needed for nausea or vomiting.  30 tablet  1   No current facility-administered medications on file prior to visit.     Allergies  Allergen Reactions  . Ivp Dye [Iodinated Diagnostic Agents] Anaphylaxis  . Penicillins Rash     Immunization History  Administered Date(s) Administered  . Influenza-Unspecified 10/29/2012  . Pneumococcal Polysaccharide-23 12/28/2012  . Tdap 12/28/2012       Objective:   Physical Exam  Nursing note and vitals reviewed. Skin:  She has an abscess in her right groin.        Assessment & Plan:    Karen KitchensBobbie was seen today for abscess.  Diagnoses and associated orders for this visit:  Cellulitis and abscess of leg Comments: The lesion has a bluish tint like you see in MRSA infections so we'll treat as such.   - doxycycline (VIBRAMYCIN) 100 MG capsule; Take 1 capsule (100 mg total) by mouth 2 (two) times daily. - rifampin (RIFADIN) 300 MG capsule; Take 2 capsules po daily for 5 days  Candidiasis of genitalia in female - fluconazole (DIFLUCAN) 150 MG tablet; Take 1 tablet  (150 mg total) by mouth once. - terconazole (TERAZOL 3) 0.8 % vaginal cream; Place 1 applicator vaginally at bedtime. Take for 3 days

## 2013-06-25 ENCOUNTER — Encounter: Payer: Self-pay | Admitting: Family Medicine

## 2013-06-25 ENCOUNTER — Ambulatory Visit (INDEPENDENT_AMBULATORY_CARE_PROVIDER_SITE_OTHER): Payer: 59 | Admitting: Family Medicine

## 2013-06-25 VITALS — BP 126/80 | HR 91 | Resp 16 | Wt 182.0 lb

## 2013-06-25 DIAGNOSIS — K0889 Other specified disorders of teeth and supporting structures: Secondary | ICD-10-CM

## 2013-06-25 DIAGNOSIS — R51 Headache: Secondary | ICD-10-CM

## 2013-06-25 DIAGNOSIS — K089 Disorder of teeth and supporting structures, unspecified: Secondary | ICD-10-CM

## 2013-06-25 DIAGNOSIS — R110 Nausea: Secondary | ICD-10-CM

## 2013-06-25 MED ORDER — BUTALBITAL-ACETAMINOPHEN 50-300 MG PO TABS: ORAL_TABLET | ORAL | Status: DC

## 2013-06-25 MED ORDER — CLINDAMYCIN HCL 300 MG PO CAPS: 300.0000 mg | ORAL_CAPSULE | Freq: Four times a day (QID) | ORAL | Status: DC

## 2013-06-25 MED ORDER — ONDANSETRON 8 MG PO TBDP: 8.0000 mg | ORAL_TABLET | Freq: Three times a day (TID) | ORAL | Status: DC | PRN

## 2013-06-25 NOTE — Progress Notes (Signed)
Subjective:    Patient ID: Valerie Merritt, female    DOB: 1970-07-06, 43 y.o.   MRN: [id number]  HPI  Karen KitchensBobbie is here today complaining of a couple of issues:  1)  Headaches - She has been having increased headaches.    2)  Tooth pain. She has a chipped tooth. She has an appointment with her dentist to have this fixed however she needs a RX for clindamycin before her procedure.     Review of Systems  Constitutional: Negative for activity change, appetite change and fatigue.  HENT:       Tooth pain  Cardiovascular: Negative for chest pain, palpitations and leg swelling.  Gastrointestinal: Positive for nausea.  Neurological: Positive for headaches.  All other systems reviewed and are negative.   Past Medical History  Diagnosis Date  . Anxiety   . TMJ (dislocation of temporomandibular joint)   . Insomnia   . Obesity   . 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    . Pneumonia      Past Surgical History  Procedure Laterality Date  . Appendectomy  1985  . Intrauterine device (iud) insertion  12/2011  . 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  She wants to quit and is currently the nicotine patch.     Alcohol Use:  None    Drug Use:  None   Diet:  Regular   Exercise:  Walking three times per week.    Hobbies: Reading, Sewing       Family History  Problem Relation Age of Onset  . Diabetes Mother   . Hypertension Mother   . Heart disease Father   . Cancer - Colon Maternal Grandfather   . Cancer Maternal Grandfather 50    Colon    . Multiple sclerosis Sister   . Hepatitis C Brother   . Other Brother     Avascular Necrosis  . Drug abuse Brother     Crack, Pain Meds   . Cancer Daughter     Hodgkin's Lymphoma   . Stroke Maternal Grandmother   . Hypertension Maternal Grandmother   . Cancer Sister     Brain      Current Outpatient Prescriptions on File Prior to Visit  Medication Sig Dispense Refill  . triamterene-hydrochlorothiazide (MAXZIDE-25) 37.5-25 MG per tablet Take 1/2 -1 tab po q am for swelling/BP  90 tablet  3  . cholecalciferol (VITAMIN D) 1000 UNITS tablet Take 1,000 Units by mouth daily.       No current facility-administered medications on file prior to visit.     Allergies  Allergen Reactions  . Ivp Dye [Iodinated Diagnostic Agents] Anaphylaxis  . Penicillins Rash     Immunization History  Administered Date(s) Administered  . Influenza-Unspecified 10/29/2012  . Pneumococcal Polysaccharide-23 12/28/2012  . Tdap 12/28/2012       Objective:   Physical Exam  Constitutional: She appears well-nourished. No distress.  HENT:  Mouth/Throat: Abnormal dentition.  Cardiovascular: Normal rate, regular rhythm and normal heart sounds.   Pulmonary/Chest: Effort normal and breath sounds normal.  Neurological: She is alert.  Psychiatric: She has a normal mood and affect. Her behavior is normal. Judgment and thought content normal.      Assessment & Plan:    Karen KitchensBobbie was seen today for headache and dental pain.  Diagnoses and associated orders for this visit:  Headache(784.0) - Butalbital-Acetaminophen 50-300 MG TABS; Take 1-2 tabs up to twice a day as directed  Tooth pain - clindamycin (CLEOCIN) 300 MG capsule; Take 1 capsule (300 mg total) by mouth 4 (four) times daily.  Nausea alone - ondansetron (ZOFRAN ODT) 8 MG disintegrating tablet; Take 1 tablet (8 mg total) by mouth every 8 (eight) hours as needed for nausea or vomiting.

## 2013-07-25 ENCOUNTER — Encounter: Payer: Self-pay | Admitting: Family Medicine

## 2013-07-25 ENCOUNTER — Ambulatory Visit (INDEPENDENT_AMBULATORY_CARE_PROVIDER_SITE_OTHER): Payer: 59 | Admitting: Family Medicine

## 2013-07-25 VITALS — BP 103/71 | HR 87 | Resp 16 | Wt 182.0 lb

## 2013-07-25 DIAGNOSIS — G4700 Insomnia, unspecified: Secondary | ICD-10-CM

## 2013-07-25 DIAGNOSIS — F411 Generalized anxiety disorder: Secondary | ICD-10-CM

## 2013-07-25 MED ORDER — CITALOPRAM HYDROBROMIDE 40 MG PO TABS: 40.0000 mg | ORAL_TABLET | Freq: Every day | ORAL | Status: DC

## 2013-07-25 MED ORDER — ZALEPLON 10 MG PO CAPS: 10.0000 mg | ORAL_CAPSULE | Freq: Every day | ORAL | Status: DC

## 2013-07-25 NOTE — Progress Notes (Signed)
Subjective:    Patient ID: Valerie Merritt, female    DOB: 1970-11-19, 43 y.o.   MRN: [id number]  HPI  Karen KitchensBobbie is here today to get refills for her Celexa and Sonata.  Her mood is good on Celexa.  She only takes Restaurant manager, fast foodonata occasionally.     Review of Systems  Constitutional: Negative for activity change, appetite change and fatigue.  Cardiovascular: Negative for chest pain, palpitations and leg swelling.  Psychiatric/Behavioral: Positive for sleep disturbance. Negative for behavioral problems. The patient is not nervous/anxious.   All other systems reviewed and are negative.   Past Medical History  Diagnosis Date  . Anxiety   . TMJ (dislocation of temporomandibular joint)   . Insomnia   . Obesity   . 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    . Pneumonia      Past Surgical History  Procedure Laterality Date  . Appendectomy  1985  . Intrauterine device (iud) insertion  12/2011  . Urethral dilation       History   Social History Narrative   Marital Status: [place] Cur[place]   Children:  Daughter [name] Son [name]     Pets: Dogs (3) Cat (1) Bird (1) Chickens (>20)  Ducks (>4)    Living Situation: Lives with husband and children.   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  She wants to quit and is currently the nicotine patch.     Alcohol Use:  None    Drug Use:  None   Diet:  Regular   Exercise:  Walking three times per week.    Hobbies: Reading, Sewing       Family History  Problem Relation Age of Onset  . Diabetes Mother   . Hypertension Mother   . Heart disease Father   . Cancer - Colon Maternal Grandfather   . Cancer Maternal Grandfather 50    Colon   . Multiple sclerosis Sister   . Hepatitis C Brother   . Other Brother     Avascular Necrosis  . Drug abuse Brother    Crack, Pain Meds   . Cancer Daughter     Hodgkin's Lymphoma   . Stroke Maternal Grandmother   . Hypertension Maternal Grandmother   . Cancer Sister     Brain      Current Outpatient Prescriptions on File Prior to Visit  Medication Sig Dispense Refill  . Butalbital-Acetaminophen 50-300 MG TABS Take 1-2 tabs up to twice a day as directed  60 tablet  0  . cholecalciferol (VITAMIN D) 1000 UNITS tablet Take 1,000 Units by mouth daily.      Marland Kitchen. triamterene-hydrochlorothiazide (MAXZIDE-25) 37.5-25 MG per tablet Take 1/2 -1 tab po q am for swelling/BP  90 tablet  3   No current facility-administered medications on file prior to visit.     Allergies  Allergen Reactions  . Ivp Dye [Iodinated Diagnostic Agents] Anaphylaxis  . Penicillins Rash     Immunization History  Administered Date(s) Administered  . Influenza-Unspecified 10/29/2012  . Pneumococcal Polysaccharide-23 12/28/2012  . Tdap 12/28/2012       Objective:   Physical Exam  Nursing note and vitals reviewed. Constitutional: She appears well-nourished. No distress.  Cardiovascular: Normal rate, regular rhythm and normal heart sounds.   Pulmonary/Chest: Effort normal  and breath sounds normal.  Neurological: She is alert.  Psychiatric: She has a normal mood and affect. Her behavior is normal. Judgment and thought content normal.      Assessment & Plan:    Angelissa was seen today for medication management.  Diagnoses and associated orders for this visit:  Insomnia - zaleplon (SONATA) 10 MG capsule; Take 1 capsule (10 mg total) by mouth at bedtime.  Anxiety state, unspecified - citalopram (CELEXA) 40 MG tablet; Take 1 tablet (40 mg total) by mouth at bedtime.

## 2013-09-14 DIAGNOSIS — R110 Nausea: Secondary | ICD-10-CM | POA: Insufficient documentation

## 2013-09-14 DIAGNOSIS — R51 Headache: Secondary | ICD-10-CM

## 2013-09-14 DIAGNOSIS — K0889 Other specified disorders of teeth and supporting structures: Secondary | ICD-10-CM | POA: Insufficient documentation

## 2013-09-14 DIAGNOSIS — R519 Headache, unspecified: Secondary | ICD-10-CM | POA: Insufficient documentation

## 2013-09-23 ENCOUNTER — Ambulatory Visit (INDEPENDENT_AMBULATORY_CARE_PROVIDER_SITE_OTHER): Payer: Commercial Managed Care - PPO | Admitting: Internal Medicine

## 2013-09-23 ENCOUNTER — Encounter: Payer: Self-pay | Admitting: Internal Medicine

## 2013-09-23 VITALS — BP 122/82 | HR 79 | Temp 97.8°F | Resp 16 | Ht 64.0 in | Wt 184.0 lb

## 2013-09-23 DIAGNOSIS — F17200 Nicotine dependence, unspecified, uncomplicated: Secondary | ICD-10-CM

## 2013-09-23 DIAGNOSIS — G4700 Insomnia, unspecified: Secondary | ICD-10-CM

## 2013-09-23 DIAGNOSIS — F411 Generalized anxiety disorder: Secondary | ICD-10-CM

## 2013-09-23 MED ORDER — CITALOPRAM HYDROBROMIDE 40 MG PO TABS: 40.0000 mg | ORAL_TABLET | Freq: Every day | ORAL | Status: DC

## 2013-09-23 NOTE — Patient Instructions (Signed)
See me at CPE 

## 2013-09-23 NOTE — Progress Notes (Signed)
Subjective:    Patient ID: Valerie Merritt, female    DOB: 05/26/1970, 43 y.o.   MRN: [id number]  HPI  Valerie Merritt  Is here as a new pt first visit for primary care.    PMH of anxiety well controlled on Celexa,  Tobacco use,  Insomnia  (related to night shift work - controlled with [employer] ) and overweight.    Overall doing well .  She is working as telephone triage on night shift  See labs.  WBC elevated when pt admitted to hospital with  Bronchitis  and was given IV steroids.    She had acute SOB and tachycardia after Dulera use.    Valerie Merritt asks that labs be checked at her CPE in November -she declines labs today  Allergies  Allergen Reactions  . Ivp Dye [Iodinated Diagnostic Agents] Anaphylaxis  . Penicillins Rash  . Dulera [Mometasone Furo-Formoterol Fum]     SOB and tachycardia   Past Medical History  Diagnosis Date  . Anxiety   . TMJ (dislocation of temporomandibular joint)   . Insomnia   . Obesity   . 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    . Pneumonia   . History of appendectomy 1985   Past Surgical History  Procedure Laterality Date  . Appendectomy  1985  . Intrauterine device (iud) insertion  12/2011  . Urethral dilation     History   Social History  . Marital Status: Married    Spouse Name: Valerie Merritt     Number of Children: 2  . Years of Education: 14   Occupational History  . RN [employer]   Social History Main Topics  . Smoking status: Current Every Day Smoker -- 1.00 packs/day for 25 years    Types: Cigarettes  . Smokeless tobacco: Never Used     Comment: She is down to 1/2 ppd; She is using the patch occasionally.   . Alcohol Use: No  . Drug Use: No  . Sexual Activity: Yes    Partners: Male    Birth Control/ Protection: IUD   Other Topics Concern  . Not on file   Social History Narrative   Marital Status: Married Valerie Merritt) [place]   Children:  Daughter Valerie Merritt Dec) Son Valerie Merritt)     Pets:  Dogs (3) Cat (1) Bird (1) Chickens (>20)  Ducks (>4)    Living Situation: Lives with husband and children.   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  She wants to quit and is currently the nicotine patch.     Alcohol Use:  None    Drug Use:  None   Diet:  Regular   Exercise:  Walking three times per week.    Hobbies: Reading, Sewing     Family History  Problem Relation Age of Onset  . Diabetes Mother   . Hypertension Mother   . Heart disease Father   . Cancer - Colon Maternal Grandfather   . Cancer Maternal Grandfather 50    Colon   . Multiple sclerosis Sister   . Hepatitis C Brother   . Other Brother     Avascular Necrosis  . Drug abuse Brother     Crack, Pain Meds   . Cancer Daughter     Hodgkin's Lymphoma   . Stroke Maternal  Grandmother   . Hypertension Maternal Grandmother   . Cancer Sister     Brain    Patient Active Problem List   Diagnosis Date Noted  . Headache(784.0) 09/14/2013  . Nausea alone 09/14/2013  . Tobacco use disorder 03/23/2013  . Anxiety state, unspecified 12/23/2012  . Insomnia 12/23/2012  . Obesity, unspecified 12/23/2012  . TMJ (dislocation of temporomandibular joint) 12/23/2012  . Left facial pressure and pain 07/22/2012  . Mixed incontinence 01/11/2012   Current Outpatient Prescriptions on File Prior to Visit  Medication Sig Dispense Refill  . zaleplon (SONATA) 10 MG capsule Take 1 capsule (10 mg total) by mouth at bedtime.  90 capsule  0  . Butalbital-Acetaminophen 50-300 MG TABS Take 1-2 tabs up to twice a day as directed  60 tablet  0  . cholecalciferol (VITAMIN D) 1000 UNITS tablet Take 1,000 Units by mouth daily.       No current facility-administered medications on file prior to visit.      Review of Systems See HPI    Objective:   Physical Exam Physical Exam  Nursing note and vitals reviewed.  Constitutional:  She is oriented to person, place, and time. She appears well-developed and well-nourished.  HENT:  Head: Normocephalic and atraumatic.  Cardiovascular: Normal rate and regular rhythm. Exam reveals no gallop and no friction rub.  No murmur heard.  Pulmonary/Chest: Breath sounds normal. She has no wheezes. She has no rales.  Neurological: She is alert and oriented to person, place, and time.  Skin: Skin is warm and dry.  Psychiatric: She has a normal mood and affect. Her behavior is normal.         Assessment & Plan:  Anxiety  Refill celexa  Insomnia  OK for prn Sonata  Leukocytosis :  Likely due to steroids    Pt declines labs today  Tobacco use  Advised cessation  She has nicotine patches at home   FH breast cancer MGM age 43 and MGF colon CA  Age 43  See me at CPE

## 2013-12-16 ENCOUNTER — Encounter: Payer: Self-pay | Admitting: Internal Medicine

## 2013-12-25 ENCOUNTER — Other Ambulatory Visit: Payer: Self-pay | Admitting: *Deleted

## 2013-12-25 DIAGNOSIS — Z0000 Encounter for general adult medical examination without abnormal findings: Secondary | ICD-10-CM

## 2013-12-25 DIAGNOSIS — E559 Vitamin D deficiency, unspecified: Secondary | ICD-10-CM

## 2013-12-26 ENCOUNTER — Encounter: Payer: Self-pay | Admitting: Internal Medicine

## 2013-12-26 ENCOUNTER — Ambulatory Visit (INDEPENDENT_AMBULATORY_CARE_PROVIDER_SITE_OTHER): Payer: 59 | Admitting: Internal Medicine

## 2013-12-26 VITALS — BP 133/86 | HR 94 | Resp 16 | Ht 63.5 in | Wt 183.0 lb

## 2013-12-26 DIAGNOSIS — F17219 Nicotine dependence, cigarettes, with unspecified nicotine-induced disorders: Secondary | ICD-10-CM

## 2013-12-26 DIAGNOSIS — Z803 Family history of malignant neoplasm of breast: Secondary | ICD-10-CM

## 2013-12-26 DIAGNOSIS — Z716 Tobacco abuse counseling: Secondary | ICD-10-CM

## 2013-12-26 DIAGNOSIS — Z23 Encounter for immunization: Secondary | ICD-10-CM

## 2013-12-26 DIAGNOSIS — F17200 Nicotine dependence, unspecified, uncomplicated: Secondary | ICD-10-CM

## 2013-12-26 DIAGNOSIS — F5101 Primary insomnia: Secondary | ICD-10-CM

## 2013-12-26 DIAGNOSIS — Z1231 Encounter for screening mammogram for malignant neoplasm of breast: Secondary | ICD-10-CM

## 2013-12-26 DIAGNOSIS — Z0000 Encounter for general adult medical examination without abnormal findings: Secondary | ICD-10-CM

## 2013-12-26 DIAGNOSIS — F411 Generalized anxiety disorder: Secondary | ICD-10-CM

## 2013-12-26 DIAGNOSIS — Z800 Family history of malignant neoplasm of digestive organs: Secondary | ICD-10-CM | POA: Insufficient documentation

## 2013-12-26 DIAGNOSIS — G4700 Insomnia, unspecified: Secondary | ICD-10-CM

## 2013-12-26 LAB — CBC WITH DIFFERENTIAL/PLATELET
BASOS ABS: 0 10*3/uL (ref 0.0–0.1)
Basophils Relative: 0 % (ref 0–1)
EOS PCT: 1 % (ref 0–5)
Eosinophils Absolute: 0.1 10*3/uL (ref 0.0–0.7)
HCT: 40.8 % (ref 36.0–46.0)
Hemoglobin: 14.4 g/dL (ref 12.0–15.0)
LYMPHS PCT: 35 % (ref 12–46)
Lymphs Abs: 3.2 10*3/uL (ref 0.7–4.0)
MCH: 33 pg (ref 26.0–34.0)
MCHC: 35.3 g/dL (ref 30.0–36.0)
MCV: 93.4 fL (ref 78.0–100.0)
Monocytes Absolute: 0.4 10*3/uL (ref 0.1–1.0)
Monocytes Relative: 4 % (ref 3–12)
NEUTROS ABS: 5.5 10*3/uL (ref 1.7–7.7)
NEUTROS PCT: 60 % (ref 43–77)
PLATELETS: 293 10*3/uL (ref 150–400)
RBC: 4.37 MIL/uL (ref 3.87–5.11)
RDW: 12.8 % (ref 11.5–15.5)
WBC: 9.2 10*3/uL (ref 4.0–10.5)

## 2013-12-26 LAB — COMPLETE METABOLIC PANEL WITH GFR
ALT: 26 U/L (ref 0–35)
AST: 15 U/L (ref 0–37)
Albumin: 4.3 g/dL (ref 3.5–5.2)
Alkaline Phosphatase: 63 U/L (ref 39–117)
BILIRUBIN TOTAL: 0.5 mg/dL (ref 0.2–1.2)
BUN: 6 mg/dL (ref 6–23)
CALCIUM: 9.5 mg/dL (ref 8.4–10.5)
CO2: 26 mEq/L (ref 19–32)
CREATININE: 0.83 mg/dL (ref 0.50–1.10)
Chloride: 105 mEq/L (ref 96–112)
GFR, Est African American: >89 mL/min
GFR, Est Non African American: 88 mL/min
Glucose, Bld: 99 mg/dL (ref 70–99)
Potassium: 4.5 mEq/L (ref 3.5–5.3)
Sodium: 138 mEq/L (ref 135–145)
Total Protein: 6.9 g/dL (ref 6.0–8.3)

## 2013-12-26 LAB — POCT URINALYSIS DIPSTICK
Bilirubin, UA: NEGATIVE
Clarity, UA: NEGATIVE
Glucose, UA: NEGATIVE
Ketones, UA: NEGATIVE
Leukocytes, UA: NEGATIVE
NITRITE UA: NEGATIVE
PROTEIN UA: NEGATIVE
RBC UA: NEGATIVE
Spec Grav, UA: 1.02
UROBILINOGEN UA: NEGATIVE
pH, UA: 6.5

## 2013-12-26 LAB — LIPID PANEL
Cholesterol: 186 mg/dL (ref 0–200)
HDL: 38 mg/dL — AB (ref >39)
LDL CALC: 121 mg/dL — AB (ref 0–99)
TRIGLYCERIDES: 135 mg/dL (ref <150)
Total CHOL/HDL Ratio: 4.9 Ratio
VLDL: 27 mg/dL (ref 0–40)

## 2013-12-26 LAB — TSH: TSH: 2.471 u[IU]/mL (ref 0.350–4.500)

## 2013-12-26 NOTE — Progress Notes (Signed)
Subjective:    Patient ID: Valerie Merritt, female    DOB: [birth year], 43 y.o.   MRN: [id number]  HPI  09/2013 note Anxiety Refill celexa  Insomnia OK for prn Sonata  Leukocytosis : Likely due to steroids Pt declines labs today  Tobacco use Advised cessation She has nicotine patches at home   FH breast cancer MGM age 43 and MGF colon CA Age 43  See me at CPE   Today :   Valerie KitchensBobbie is here for CPE  HM:  Due for mm screening 02/2014,  Strong FH of colonoscopy (PGF coon CA, cousin CA in his 4040's, 2 Puncles with colon Ca ),  She is 25 pack-year smoker  Tobacco use pt has cut down to 1/2 ppd and is trying to quit  She reports that 2 months ago she had episode of 2 weeks of vaginal spotting .  She has Mirena for the past 2 years   Allergies  Allergen Reactions  . Ivp Dye [Iodinated Diagnostic Agents] Anaphylaxis  . Penicillins Rash  . Dulera [Mometasone Furo-Formoterol Fum]     SOB and tachycardia   Past Medical History  Diagnosis Date  . Anxiety   . TMJ (dislocation of temporomandibular joint)   . Insomnia   . Obesity   . 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    . Pneumonia   . History of appendectomy 1985   Past Surgical History  Procedure Laterality Date  . Appendectomy  1985  . Intrauterine device (iud) insertion  12/2011  . Urethral dilation     History   Social History  . Marital Status: Married    Spouse Name: Valerie Merritt     Number of Children: 2  . Years of Education: 14   Occupational History  . RN [employer]   Social History Main Topics  . Smoking status: Current Every Day Smoker -- 1.00 packs/day for 25 years    Types: Cigarettes  . Smokeless tobacco: Never Used     Comment: She is down to 1/2 ppd; She is using the patch occasionally.   . Alcohol Use: No  . Drug Use: No  . Sexual Activity:    Partners: Male    Birth Control/ Protection: IUD   Other Topics Concern  .  Not on file   Social History Narrative   Marital Status: Married Valerie Glimpse([place]ard) [place]   Children:  Daughter Valerie Dec(Sara) Son Valerie Hoit(Levi)     Pets: Dogs (3) Cat (1) Bird (1) Chickens (>20)  Ducks (>4)    Living Situation: Lives with husband and children.   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  She wants to quit and is currently the nicotine patch.     Alcohol Use:  None    Drug Use:  None   Diet:  Regular   Exercise:  Walking three times per week.    Hobbies: Reading, Sewing     Family History  Problem Relation Age of Onset  . Diabetes Mother   . Hypertension Mother   . Heart disease Father   . Cancer - Colon Maternal Grandfather   . Cancer Maternal Grandfather 50    Colon   . Multiple sclerosis Sister   . Hepatitis C Brother   . Other Brother     Avascular  Necrosis  . Drug abuse Brother     Crack, Pain Meds   . Cancer Daughter     Hodgkin's Lymphoma   . Stroke Maternal Grandmother   . Hypertension Maternal Grandmother   . Cancer Sister     Brain    Patient Active Problem List   Diagnosis Date Noted  . Headache(784.0) 09/14/2013  . Nausea alone 09/14/2013  . Tobacco use disorder 03/23/2013  . Anxiety state, unspecified 12/23/2012  . Insomnia 12/23/2012  . Obesity, unspecified 12/23/2012  . TMJ (dislocation of temporomandibular joint) 12/23/2012  . Left facial pressure and pain 07/22/2012  . Mixed incontinence 01/11/2012   Current Outpatient Prescriptions on File Prior to Visit  Medication Sig Dispense Refill  . cholecalciferol (VITAMIN D) 1000 UNITS tablet Take 1,000 Units by mouth daily.    . citalopram (CELEXA) 40 MG tablet Take 1 tablet (40 mg total) by mouth at bedtime. 90 tablet 1  . zaleplon (SONATA) 10 MG capsule Take 1 capsule (10 mg total) by mouth at bedtime. 90 capsule 0   No current facility-administered medications on file prior to visit.       Review of Systems  Respiratory: Negative for cough, choking, chest tightness, shortness of breath and wheezing.   Cardiovascular: Negative for chest pain, palpitations and leg swelling.  All other systems reviewed and are negative.      Objective:   Physical Exam Physical Exam  Nursing note and vitals reviewed.  Constitutional: She is oriented to person, place, and time. She appears well-developed and well-nourished.  HENT:  Head: Normocephalic and atraumatic.  Right Ear: Tympanic membrane and ear canal normal. No drainage. Tympanic membrane is not injected and not erythematous.  Left Ear: Tympanic membrane and ear canal normal. No drainage. Tympanic membrane is not injected and not erythematous.  Nose: Nose normal. Right sinus exhibits no maxillary sinus tenderness and no frontal sinus tenderness. Left sinus exhibits no maxillary sinus tenderness and no frontal sinus tenderness.  Mouth/Throat: Oropharynx is clear and moist. No oral lesions. No oropharyngeal exudate.  Eyes: Conjunctivae and EOM are normal. Pupils are equal, round, and reactive to light.  Neck: Normal range of motion. Neck supple. No JVD present. Carotid bruit is not present. No mass and no thyromegaly present.  Cardiovascular: Normal rate, regular rhythm, S1 normal, S2 normal and intact distal pulses. Exam reveals no gallop and no friction rub.  No murmur heard.  Pulses:  Carotid pulses are 2+ on the right side, and 2+ on the left side.  Dorsalis pedis pulses are 2+ on the right side, and 2+ on the left side.  No carotid bruit. No LE edema  Pulmonary/Chest: Breath sounds normal. She has no wheezes. She has no rales. She exhibits no tenderness.  Breast no discrete mass no nipple discharge no axillary adenopathy bilaterally  Abdominal: Soft. Bowel sounds are normal. She exhibits no distension and no mass. There is no hepatosplenomegaly. There is no tenderness. There is no CVA tenderness.  Musculoskeletal: Normal  range of motion.  No active synovitis to joints.  Lymphadenopathy:  She has no cervical adenopathy.  She has no axillary adenopathy.  Right: No inguinal and no supraclavicular adenopathy present.  Left: No inguinal and no supraclavicular adenopathy present.  Neurological: She is alert and oriented to person, place, and time. She has normal strength and normal reflexes. She displays no tremor. No cranial nerve deficit or sensory deficit. Coordination and gait normal.  Skin: Skin is warm and dry. No rash noted. No  cyanosis. Nails show no clubbing.  Psychiatric: She has a normal mood and affect. Her speech is normal and behavior is normal. Cognition and memory are normal.        Assessment & Plan:  HM:  Mm due in 02/2013,  Pap her gyn,   Will discuss early colonoscopy with GI    Counseled regarding lung cancer screening guidelines pt a 25 pack yr smoker  Tobacco use.  Has decreased smoking to 1/2 ppd .     Anxiety: controlled with Celexa.  Vaginal spotting for 2 weeks  with Mirena:    Advised to see GYN .    Strong FH of colon CA multiple second generation.

## 2013-12-26 NOTE — Addendum Note (Signed)
Addended by: Raechel ChuteSCHOENHOFF, Kymari Lollis D on: 12/26/2013 12:58 PM   Modules accepted: Orders

## 2013-12-27 LAB — VITAMIN D 25 HYDROXY (VIT D DEFICIENCY, FRACTURES): Vit D, 25-Hydroxy: 25 ng/mL — ABNORMAL LOW (ref 30–89)

## 2013-12-28 ENCOUNTER — Encounter: Payer: Self-pay | Admitting: Internal Medicine

## 2013-12-30 NOTE — Progress Notes (Signed)
I left Valerie KitchensBobbie a message letting know to check her "my chart" email for a message from Dr. Constance GoltzSchoenhoff

## 2014-02-13 ENCOUNTER — Encounter (HOSPITAL_COMMUNITY): Payer: Self-pay | Admitting: *Deleted

## 2014-02-13 ENCOUNTER — Other Ambulatory Visit: Payer: Self-pay | Admitting: Family Medicine

## 2014-02-13 ENCOUNTER — Inpatient Hospital Stay (HOSPITAL_COMMUNITY)
Admission: EM | Admit: 2014-02-13 | Discharge: 2014-02-15 | DRG: 419 | Disposition: A | Discharge status: Home / Self Care | Payer: 59 | Attending: Surgery | Admitting: Surgery

## 2014-02-13 ENCOUNTER — Ambulatory Visit (HOSPITAL_BASED_OUTPATIENT_CLINIC_OR_DEPARTMENT_OTHER)
Admission: RE | Admit: 2014-02-13 | Discharge: 2014-02-13 | Disposition: A | Discharge status: Home / Self Care | Payer: 59 | Source: Ambulatory Visit | Attending: Family Medicine | Admitting: Family Medicine

## 2014-02-13 ENCOUNTER — Emergency Department (INDEPENDENT_AMBULATORY_CARE_PROVIDER_SITE_OTHER)
Admission: EM | Admit: 2014-02-13 | Discharge: 2014-02-13 | Disposition: A | Discharge status: Home / Self Care | Payer: 59 | Source: Home / Self Care | Attending: Family Medicine | Admitting: Family Medicine

## 2014-02-13 ENCOUNTER — Telehealth: Payer: Self-pay | Admitting: Internal Medicine

## 2014-02-13 ENCOUNTER — Encounter: Payer: Self-pay | Admitting: *Deleted

## 2014-02-13 DIAGNOSIS — K810 Acute cholecystitis: Secondary | ICD-10-CM | POA: Diagnosis present

## 2014-02-13 DIAGNOSIS — Z91041 Radiographic dye allergy status: Secondary | ICD-10-CM | POA: Diagnosis not present

## 2014-02-13 DIAGNOSIS — K812 Acute cholecystitis with chronic cholecystitis: Principal | ICD-10-CM | POA: Diagnosis present

## 2014-02-13 DIAGNOSIS — K8010 Calculus of gallbladder with chronic cholecystitis without obstruction: Secondary | ICD-10-CM | POA: Diagnosis present

## 2014-02-13 DIAGNOSIS — Z823 Family history of stroke: Secondary | ICD-10-CM

## 2014-02-13 DIAGNOSIS — R1011 Right upper quadrant pain: Secondary | ICD-10-CM

## 2014-02-13 DIAGNOSIS — R112 Nausea with vomiting, unspecified: Secondary | ICD-10-CM

## 2014-02-13 DIAGNOSIS — F419 Anxiety disorder, unspecified: Secondary | ICD-10-CM | POA: Diagnosis present

## 2014-02-13 DIAGNOSIS — F17210 Nicotine dependence, cigarettes, uncomplicated: Secondary | ICD-10-CM | POA: Diagnosis present

## 2014-02-13 DIAGNOSIS — E669 Obesity, unspecified: Secondary | ICD-10-CM | POA: Diagnosis present

## 2014-02-13 DIAGNOSIS — Z9049 Acquired absence of other specified parts of digestive tract: Secondary | ICD-10-CM | POA: Diagnosis present

## 2014-02-13 DIAGNOSIS — Z880 Allergy status to penicillin: Secondary | ICD-10-CM

## 2014-02-13 DIAGNOSIS — Z8249 Family history of ischemic heart disease and other diseases of the circulatory system: Secondary | ICD-10-CM

## 2014-02-13 LAB — COMPREHENSIVE METABOLIC PANEL
ALBUMIN: 4 g/dL (ref 3.5–5.2)
ALK PHOS: 67 U/L (ref 39–117)
ALT: 34 U/L (ref 0–35)
AST: 23 U/L (ref 0–37)
Anion gap: 8 (ref 5–15)
BILIRUBIN TOTAL: 0.6 mg/dL (ref 0.3–1.2)
BUN: <5 mg/dL — ABNORMAL LOW (ref 6–23)
CALCIUM: 9 mg/dL (ref 8.4–10.5)
CHLORIDE: 107 meq/L (ref 96–112)
CO2: 24 mmol/L (ref 19–32)
Creatinine, Ser: 0.92 mg/dL (ref 0.50–1.10)
GFR calc Af Amer: 88 mL/min — ABNORMAL LOW (ref >90)
GFR, EST NON AFRICAN AMERICAN: 76 mL/min — AB (ref >90)
Glucose, Bld: 92 mg/dL (ref 70–99)
Potassium: 3.9 mmol/L (ref 3.5–5.1)
Sodium: 139 mmol/L (ref 135–145)
Total Protein: 6.6 g/dL (ref 6.0–8.3)

## 2014-02-13 LAB — CBC WITH DIFFERENTIAL/PLATELET
Basophils Absolute: 0 10*3/uL (ref 0.0–0.1)
Basophils Relative: 0 % (ref 0–1)
Eosinophils Absolute: 0.1 10*3/uL (ref 0.0–0.7)
Eosinophils Relative: 1 % (ref 0–5)
HEMATOCRIT: 41.2 % (ref 36.0–46.0)
Hemoglobin: 14.3 g/dL (ref 12.0–15.0)
LYMPHS PCT: 30 % (ref 12–46)
Lymphs Abs: 3.6 10*3/uL (ref 0.7–4.0)
MCH: 32.9 pg (ref 26.0–34.0)
MCHC: 34.7 g/dL (ref 30.0–36.0)
MCV: 94.7 fL (ref 78.0–100.0)
MONO ABS: 0.5 10*3/uL (ref 0.1–1.0)
MONOS PCT: 4 % (ref 3–12)
NEUTROS ABS: 7.8 10*3/uL — AB (ref 1.7–7.7)
Neutrophils Relative %: 65 % (ref 43–77)
PLATELETS: 295 10*3/uL (ref 150–400)
RBC: 4.35 MIL/uL (ref 3.87–5.11)
RDW: 12.4 % (ref 11.5–15.5)
WBC: 12 10*3/uL — AB (ref 4.0–10.5)

## 2014-02-13 LAB — POCT CBC W AUTO DIFF (K'VILLE URGENT CARE)

## 2014-02-13 LAB — LIPASE, BLOOD: Lipase: 27 U/L (ref 11–59)

## 2014-02-13 LAB — LIPASE: LIPASE: 28 U/L (ref 0–75)

## 2014-02-13 LAB — COMPLETE METABOLIC PANEL WITH GFR
ALBUMIN: 4.8 g/dL (ref 3.5–5.2)
ALT: 34 U/L (ref 0–35)
AST: 20 U/L (ref 0–37)
Alkaline Phosphatase: 81 U/L (ref 39–117)
BUN: 7 mg/dL (ref 6–23)
CALCIUM: 9.9 mg/dL (ref 8.4–10.5)
CHLORIDE: 104 meq/L (ref 96–112)
CO2: 24 mEq/L (ref 19–32)
CREATININE: 0.91 mg/dL (ref 0.50–1.10)
GFR, Est African American: >89 mL/min
GFR, Est Non African American: 78 mL/min
Glucose, Bld: 78 mg/dL (ref 70–99)
Potassium: 4.6 mEq/L (ref 3.5–5.3)
Sodium: 141 mEq/L (ref 135–145)
TOTAL PROTEIN: 7.1 g/dL (ref 6.0–8.3)
Total Bilirubin: 0.4 mg/dL (ref 0.2–1.2)

## 2014-02-13 LAB — SURGICAL PCR SCREEN
MRSA, PCR: NEGATIVE
Staphylococcus aureus: NEGATIVE

## 2014-02-13 MED ORDER — KCL IN DEXTROSE-NACL 20-5-0.45 MEQ/L-%-% IV SOLN
INTRAVENOUS | Status: DC
  Administered 2014-02-13 – 2014-02-15 (×3): via INTRAVENOUS
  Filled 2014-02-13 (×6): qty 1000

## 2014-02-13 MED ORDER — METHYLPREDNISOLONE ACETATE 40 MG/ML IJ SUSP: 40.0000 mg | Freq: Once | INTRAMUSCULAR | Status: DC

## 2014-02-13 MED ORDER — CLINDAMYCIN PHOSPHATE 600 MG/50ML IV SOLN
600.0000 mg | Freq: Three times a day (TID) | INTRAVENOUS | Status: DC
  Administered 2014-02-13 – 2014-02-15 (×5): 600 mg via INTRAVENOUS
  Filled 2014-02-13 (×8): qty 50

## 2014-02-13 MED ORDER — METRONIDAZOLE IN NACL 5-0.79 MG/ML-% IV SOLN
500.0000 mg | Freq: Once | INTRAVENOUS | Status: AC
  Administered 2014-02-13: 500 mg via INTRAVENOUS
  Filled 2014-02-13: qty 100

## 2014-02-13 MED ORDER — NICOTINE 21 MG/24HR TD PT24
21.0000 mg | MEDICATED_PATCH | Freq: Every day | TRANSDERMAL | Status: DC
  Administered 2014-02-13 – 2014-02-15 (×3): 21 mg via TRANSDERMAL
  Filled 2014-02-13 (×3): qty 1

## 2014-02-13 MED ORDER — SODIUM CHLORIDE 0.9 % IV BOLUS (SEPSIS)
1000.0000 mL | Freq: Once | INTRAVENOUS | Status: AC
  Administered 2014-02-13: 1000 mL via INTRAVENOUS

## 2014-02-13 MED ORDER — DIPHENHYDRAMINE HCL 50 MG/ML IJ SOLN: 12.5000 mg | Freq: Four times a day (QID) | INTRAMUSCULAR | Status: DC | PRN

## 2014-02-13 MED ORDER — PANTOPRAZOLE SODIUM 40 MG IV SOLR
40.0000 mg | Freq: Every day | INTRAVENOUS | Status: DC
  Administered 2014-02-13 – 2014-02-14 (×2): 40 mg via INTRAVENOUS
  Filled 2014-02-13 (×3): qty 40

## 2014-02-13 MED ORDER — ONDANSETRON HCL 4 MG/2ML IJ SOLN
4.0000 mg | Freq: Four times a day (QID) | INTRAMUSCULAR | Status: DC | PRN
  Administered 2014-02-14: 4 mg via INTRAVENOUS
  Filled 2014-02-13: qty 2

## 2014-02-13 MED ORDER — ONDANSETRON 8 MG PO TBDP
8.0000 mg | ORAL_TABLET | Freq: Once | ORAL | Status: AC
  Administered 2014-02-13: 8 mg via ORAL

## 2014-02-13 MED ORDER — ACETAMINOPHEN 500 MG PO TABS
1000.0000 mg | ORAL_TABLET | Freq: Four times a day (QID) | ORAL | Status: DC | PRN
  Administered 2014-02-13: 1000 mg via ORAL
  Filled 2014-02-13: qty 2

## 2014-02-13 MED ORDER — DIPHENHYDRAMINE HCL 12.5 MG/5ML PO ELIX: 12.5000 mg | ORAL_SOLUTION | Freq: Four times a day (QID) | ORAL | Status: DC | PRN

## 2014-02-13 MED ORDER — DIPHENHYDRAMINE HCL 50 MG/ML IJ SOLN
12.5000 mg | Freq: Once | INTRAMUSCULAR | Status: AC
  Administered 2014-02-13: 12.5 mg via INTRAVENOUS
  Filled 2014-02-13: qty 1

## 2014-02-13 MED ORDER — KETOROLAC TROMETHAMINE 60 MG/2ML IM SOLN: 60.0000 mg | Freq: Once | INTRAMUSCULAR | Status: DC

## 2014-02-13 MED ORDER — CIPROFLOXACIN IN D5W 400 MG/200ML IV SOLN
400.0000 mg | Freq: Once | INTRAVENOUS | Status: AC
  Administered 2014-02-13: 400 mg via INTRAVENOUS
  Filled 2014-02-13: qty 200

## 2014-02-13 MED ORDER — ONDANSETRON 4 MG PO TBDP
8.0000 mg | ORAL_TABLET | Freq: Once | ORAL | Status: AC
  Administered 2014-02-13: 8 mg via ORAL
  Filled 2014-02-13: qty 2

## 2014-02-13 MED ORDER — METOCLOPRAMIDE HCL 5 MG/ML IJ SOLN: 5.0000 mg | Freq: Once | INTRAMUSCULAR | Status: DC

## 2014-02-13 MED ORDER — FENTANYL CITRATE 0.05 MG/ML IJ SOLN
50.0000 ug | Freq: Once | INTRAMUSCULAR | Status: AC
  Administered 2014-02-13: 50 ug via INTRAVENOUS
  Filled 2014-02-13: qty 2

## 2014-02-13 MED ORDER — CITALOPRAM HYDROBROMIDE 40 MG PO TABS
40.0000 mg | ORAL_TABLET | Freq: Every day | ORAL | Status: DC
  Administered 2014-02-13 – 2014-02-14 (×2): 40 mg via ORAL
  Filled 2014-02-13 (×3): qty 1

## 2014-02-13 MED ORDER — FENTANYL CITRATE 0.05 MG/ML IJ SOLN
50.0000 ug | INTRAMUSCULAR | Status: DC | PRN
  Administered 2014-02-13: 50 ug via INTRAVENOUS
  Administered 2014-02-14: 100 ug via INTRAVENOUS
  Filled 2014-02-13 (×3): qty 2

## 2014-02-13 NOTE — ED Provider Notes (Signed)
CSN: [id number]     Arrival date & time 02/13/14  1404 History   First MD Initiated Contact with Patient 02/13/14 1532     Chief Complaint  Patient presents with  . Abdominal Pain     Patient is a 43 y.o. female presenting with abdominal pain. The history is provided by the patient. No language interpreter was used.  Abdominal Pain  This Valerie Merritt comes in for evaluation of abdominal pain and back pain.  She reports that she has had intermittent mild right upper quadrant pain with pretty intense left sided shoulder blade back pain for the last 2 weeks. She's had one or 2 months of increasing indigestion and reflux type symptoms and fullness and bloating after meals. Right upper quadrant pain is dull and worse with meals, she has intermittent vomiting and no fevers. She was recently treated for a sinus infection with doxycycline and her last dose today. She was seen in urgent care today for her symptoms because her back pain and right upper quadrant pain became intense last night and continued throughout the night, there she had right upper quadrant ultrasound that was consistent with cholecystitis and she was referred to the emergency department for further evaluation.  Past Medical History  Diagnosis Date  . Anxiety   . TMJ (dislocation of temporomandibular joint)   . Insomnia   . Obesity   . 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    . Pneumonia   . History of appendectomy 1985   Past Surgical History  Procedure Laterality Date  . Appendectomy  1985  . Intrauterine device (iud) insertion  12/2011  . Urethral dilation     Family History  Problem Relation Age of Onset  . Diabetes Mother   . Hypertension Mother   . Heart disease Father   . Cancer - Colon Maternal Grandfather   . Cancer Maternal Grandfather 50    Colon   . Multiple sclerosis Sister   . Hepatitis C Brother   . Other Brother     Avascular Necrosis   . Drug abuse Brother     Crack, Pain Meds   . Cancer Daughter     Hodgkin's Lymphoma   . Stroke Maternal Grandmother   . Hypertension Maternal Grandmother   . Cancer Sister     Brain    History  Substance Use Topics  . Smoking status: Current Every Day Smoker -- 1.00 packs/day for 25 years    Types: Cigarettes  . Smokeless tobacco: Never Used     Comment: She is down to 1/2 ppd; She is using the patch occasionally.   . Alcohol Use: No   OB History    Gravida Para Term Preterm AB TAB SAB Ectopic Multiple Living   2 2 2       2      Review of Systems  Gastrointestinal: Positive for abdominal pain.  All other systems reviewed and are negative.     Allergies  Ivp dye; Penicillins; and Dulera  Home Medications   Prior to Admission medications   Medication Sig Start Date End Date Taking? Authorizing Provider  cholecalciferol (VITAMIN D) 1000 UNITS tablet Take 1,000 Units by mouth daily.    Historical Provider, MD  citalopram (CELEXA) 40 MG tablet Take 1 tablet (40 mg total) by mouth at bedtime. 09/23/13 09/23/14  Kendrick Rancheborah D Schoenhoff, MD  zaleplon (SONATA) 10 MG capsule Take  1 capsule (10 mg total) by mouth at bedtime. 07/25/13 07/25/14  Gillian Scarceobyn K Zanard, MD   BP 132/89 mmHg  Pulse 95  Temp(Src) 98.1 F (36.7 C) (Oral)  Resp 18  SpO2 96% Physical Exam  Constitutional: She is oriented to person, place, and time. She appears well-developed and well-nourished.  HENT:  Head: Normocephalic and atraumatic.  Cardiovascular: Normal rate and regular rhythm.   No murmur heard. Pulmonary/Chest: Effort normal and breath sounds normal. No respiratory distress.  Abdominal: Soft.  Moderate right upper quadrant tenderness, positive Murphy's  Musculoskeletal: She exhibits no edema or tenderness.  Neurological: She is alert and oriented to person, place, and time.  Skin: Skin is warm and dry.  Psychiatric: She has a normal mood and affect. Her behavior is normal.  Nursing note and vitals  reviewed.   ED Course  Procedures (including critical care time) Labs Review Labs Reviewed  CBC WITH DIFFERENTIAL - Abnormal; Notable for the following:    WBC 12.0 (*)    Neutro Abs 7.8 (*)    All other components within normal limits  COMPREHENSIVE METABOLIC PANEL - Abnormal; Notable for the following:    GFR calc non Af Amer 76 (*)    GFR calc Af Amer 88 (*)    All other components within normal limits  LIPASE, BLOOD    Imaging Review [place] Abdomen Complete  02/13/2014   CLINICAL DATA:  Two-month history of right upper quadrant pain with nausea and vomiting  EXAM: ULTRASOUND ABDOMEN COMPLETE  COMPARISON:  April 15, 2008  FINDINGS: Gallbladder: Within the gallbladder, there is a 1.7 cm in length echogenic focus which moves in shadows consistent with cholelithiasis. Gallbladder wall is mildly thickened, measuring slightly greater than 4 mm in thickness. There is no appreciable pericholecystic fluid. Patient is tender over the gallbladder.  Common bile duct: Diameter: 3 mm. There is no intrahepatic, common hepatic, or common bile duct dilatation.  Liver: No focal lesion identified. Liver echogenicity overall is increased.  IVC: No abnormality visualized in visualized portions; portions obscured by gas.  Pancreas: Most of pancreas is obscured by gas.  Spleen: Size and appearance within normal limits.  Right Kidney: Length: 10.8 cm. Echogenicity within normal limits. No mass or hydronephrosis visualized.  Left Kidney: Length: 10.4 cm. Echogenicity within normal limits. No mass or hydronephrosis visualized.  Abdominal aorta: No aneurysm visualized.  Other findings: No demonstrable ascites.  IMPRESSION: Cholelithiasis with mild gallbladder wall thickening and focal tenderness over the gallbladder. Suspect early acute cholecystitis.  Most of pancreas obscured by gas. Portions of the inferior vena cava obscured by gas.  Liver echogenicity increased, a finding most likely due to hepatic steatosis. While no  focal liver lesions are identified, it must be cautioned that the sensitivity of ultrasound for focal liver lesions is diminished in this circumstance.  These results will be called to the ordering clinician or representative by the Radiologist Assistant, and communication documented in the PACS or zVision Dashboard.   Electronically Signed   By: Bretta BangWilliam  Woodruff M.D.   On: 02/13/2014 12:01     EKG Interpretation None      MDM   Final diagnoses:  Acute cholecystitis   Reviewed imaging from urgent care, ultrasound consistent with acute cholecystitis especially given patient's history. Patient is hemodynamically stable. Providing Cipro and Flagyl and discussed with surgery.  D/w Dr. Janee Mornhompson, he will come to see the patient.   Tilden FossaElizabeth Paulina Muchmore, MD 02/13/14 1754

## 2014-02-13 NOTE — Discharge Instructions (Signed)
Thank you for coming in today. Get ultrasound today I will follow-up with results Return as needed If your belly pain worsens, or you have high fever, bad vomiting, blood in your stool or black tarry stool go to the Emergency Room.   Cholecystitis Cholecystitis is an inflammation of your gallbladder. It is usually caused by a buildup of gallstones or sludge (cholelithiasis) in your gallbladder. The gallbladder stores a fluid that helps digest fats (bile). Cholecystitis is serious and needs treatment right away.  CAUSES   Gallstones. Gallstones can block the tube that leads to your gallbladder, causing bile to build up. As bile builds up, the gallbladder becomes inflamed.  Bile duct problems, such as blockage from scarring or kinking.  Tumors. Tumors can stop bile from leaving your gallbladder correctly, causing bile to build up. As bile builds up, the gallbladder becomes inflamed. SYMPTOMS   Nausea.  Vomiting.  Abdominal pain, especially in the upper right area of your abdomen.  Abdominal tenderness or bloating.  Sweating.  Chills.  Fever.  Yellowing of the skin and the whites of the eyes (jaundice). DIAGNOSIS  Your caregiver may order blood tests to look for infection or gallbladder problems. Your caregiver may also order imaging tests, such as an ultrasound or computed tomography (CT) scan. Further tests may include a hepatobiliary iminodiacetic acid (HIDA) scan. This scan allows your caregiver to see your bile move from the liver to the gallbladder and to the small intestine. TREATMENT  A hospital stay is usually necessary to lessen the inflammation of your gallbladder. You may be required to not eat or drink (fast) for a certain amount of time. You may be given medicine to treat pain or an antibiotic medicine to treat an infection. Surgery may be needed to remove your gallbladder (cholecystectomy) once the inflammation has gone down. Surgery may be needed right away if you  develop complications such as death of gallbladder tissue (gangrene) or a tear (perforation) of the gallbladder.  HOME CARE INSTRUCTIONS  Home care will depend on your treatment. In general:  If you were given antibiotics, take them as directed. Finish them even if you start to feel better.  Only take over-the-counter or prescription medicines for pain, discomfort, or fever as directed by your caregiver.  Follow a low-fat diet until you see your caregiver again.  Keep all follow-up visits as directed by your caregiver. SEEK IMMEDIATE MEDICAL CARE IF:   Your pain is increasing and not controlled by medicines.  Your pain moves to another part of your abdomen or to your back.  You have a fever.  You have nausea and vomiting. MAKE SURE YOU:  Understand these instructions.  Will watch your condition.  Will get help right away if you are not doing well or get worse. Document Released: 01/31/2005 Document Revised: 04/25/2011 Document Reviewed: 12/17/2010 [facility] Patient Information 2015 [place]. This information is not intended to replace advice given to you by your health care provider. Make sure you discuss any questions you have with your health care provider.

## 2014-02-13 NOTE — ED Provider Notes (Signed)
Karen KitchensBobbie Senaida OresRichardson is a 43 y.o. female who presents to Urgent Care today for upper quadrant abdominal pain. Patient has a two-week history of mild dyspepsia worsening to right upper quadrant abdominal pain after eating. She denies any diarrhea. She notes an episode of vomiting 2 nights ago. The pain was quite severe and radiating to her right shoulder blade yesterday evening. She currently is pain-free. She is concerned that she has gallstones. Her primary care provider recommended that she presented to the clinic today. She has a history of an appendectomy but still has her gallbladder.   Past Medical History  Diagnosis Date  . Anxiety   . TMJ (dislocation of temporomandibular joint)   . Insomnia   . Obesity   . 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    . Pneumonia   . History of appendectomy 1985   Past Surgical History  Procedure Laterality Date  . Appendectomy  1985  . Intrauterine device (iud) insertion  12/2011  . Urethral dilation     History  Substance Use Topics  . Smoking status: Current Every Day Smoker -- 1.00 packs/day for 25 years    Types: Cigarettes  . Smokeless tobacco: Never Used     Comment: She is down to 1/2 ppd; She is using the patch occasionally.   . Alcohol Use: No   ROS as above Medications: No current facility-administered medications for this encounter.   Current Outpatient Prescriptions  Medication Sig Dispense Refill  . cholecalciferol (VITAMIN D) 1000 UNITS tablet Take 1,000 Units by mouth daily.    . citalopram (CELEXA) 40 MG tablet Take 1 tablet (40 mg total) by mouth at bedtime. 90 tablet 1  . zaleplon (SONATA) 10 MG capsule Take 1 capsule (10 mg total) by mouth at bedtime. 90 capsule 0   Allergies  Allergen Reactions  . Ivp Dye [Iodinated Diagnostic Agents] Anaphylaxis  . Penicillins Rash  . Dulera [Mometasone Furo-Formoterol Fum]     SOB and tachycardia     Exam:  BP  116/77 mmHg  Pulse 89  Temp(Src) 98.2 F (36.8 C) (Oral)  Resp 16  Wt 186 lb (84.369 kg)  SpO2 99% Gen: Well NAD HEENT: EOMI,  MMM Lungs: Normal work of breathing. CTABL Heart: RRR no MRG Abd: NABS, Soft. Nondistended, tender palpation right upper quadrant with positive Murphy sign. Exts: Brisk capillary refill, warm and well perfused.    Point-of-care CBC shows white blood cell count 9.5, hemoglobin 15.1, platelets 325. Results for orders placed or performed during the hospital encounter of 02/13/14 (from the past 24 hour(s))  POCT CBC w auto diff ([facility])     Status: None   Collection Time: 02/13/14 10:50 AM  Result Value Ref Range   WBC  4.5 - 10.5 K/uL   Lymphocytes relative %  15 - 45 %   Monocytes relative %  2 - 10 %   Neutrophils relative % (GR)  44 - 76 %   Lymphocytes absolute  0.1 - 1.8 K/uL   Monocyes absolute  0.1 - 1 K/uL   Neutrophils absolute (GR#)  1.7 - 7.8 K/uL   RBC  3.8 - 5.1 MIL/uL   Hemoglobin  11.8 - 15.5 g/dL   Hematocrit  65.734.8 - 46 %   MCV  78 - 100 fL   MCH  26 - 32 pg   MCHC  32 - 36.5 g/dL  RDW  11.6 - 14 %   Platelet count  140 - 400 K/uL   MPV  7.8 - 11 fL   No results found.  Assessment and Plan: 43 y.o. female with probable cholecystitis. Comprehensive metabolic panel and lipase pending. Abdominal ultrasound is also pending. Will call patient with results and discuss case with PCP to advance care plan.  Discussed warning signs or symptoms. Please see discharge instructions. Patient expresses understanding.     Rodolph BongEvan S Dayna Alia, MD 02/13/14 [phone]

## 2014-02-13 NOTE — Telephone Encounter (Signed)
Spoke with pt .  She reports 2 episodes in the last 4 days of severe RUQ abd pain that will radiated to left shoulder blade.  Associated with vomiting no fever.  Duration up to 2 hours.    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

## 2014-02-13 NOTE — ED Notes (Signed)
Pt sent here due to abd pain for week, has already been seen and had US done this am, was told to come here for surgical consult to remove gallbladder.

## 2014-02-13 NOTE — ED Notes (Signed)
Kaliah c/o indigestion pc x 2 weeks. Last few days had daily episodes of RUQ pain radiating to back. Vomiting 2 days ago, nausea only last night.

## 2014-02-13 NOTE — H&P (Signed)
Valerie Merritt is an 43 y.o. female.   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 She has been having episodic right upper quadrant pain extending to her back over the past several months. Over the past 2 weeks she has had 2 or 3 attacks. Today it was much worse and she came to the emergency department. Ultrasound reveals cholelithiasis with signs of cholecystitis. Her pain is improved somewhat after receiving medication. She has a mild leukocytosis. We are asked to see her for admission and treatment.  Past Medical History  Diagnosis Date  . Anxiety   . TMJ (dislocation of temporomandibular joint)   . Insomnia   . Obesity   . Urethral disorder     She had surgery at the age of 74 and has had to have it stretched several times.    . Pneumonia   . History of appendectomy 1985    Past Surgical History  Procedure Laterality Date  . Appendectomy  1985  . Intrauterine device (iud) insertion  12/2011  . Urethral dilation      Family History  Problem Relation Age of Onset  . Diabetes Mother   . Hypertension Mother   . Heart disease Father   . Cancer - Colon Maternal Grandfather   . Cancer Maternal Grandfather 50    Colon   . Multiple sclerosis Sister   . Hepatitis C Brother   . Other Brother     Avascular Necrosis  . Drug abuse Brother     Crack, Pain Meds   . Cancer Daughter     Hodgkin's Lymphoma   . Stroke Maternal Grandmother   . Hypertension Maternal Grandmother   . Cancer Sister     Brain    Social History:  reports that she has been smoking Cigarettes.  She has a 25 pack-year smoking history. She has never used smokeless tobacco. She reports that she does not drink alcohol or use illicit drugs.  Allergies:  Allergies  Allergen Reactions  . Dulera [Mometasone Furo-Formoterol Fum] Shortness Of Breath and Palpitations    SOB and tachycardia  . Ivp Dye [Iodinated Diagnostic Agents]  Anaphylaxis  . Penicillins Rash  . Ciprofloxacin Itching     (Not in a hospital admission)  Results for orders placed or performed during the hospital encounter of 02/13/14 (from the past 48 hour(s))  CBC with Differential     Status: Abnormal   Collection Time: 02/13/14  2:28 PM  Result Value Ref Range   WBC 12.0 (H) 4.0 - 10.5 K/uL   RBC 4.35 3.87 - 5.11 MIL/uL   Hemoglobin 14.3 12.0 - 15.0 g/dL   HCT 41.2 36.0 - 46.0 %   MCV 94.7 78.0 - 100.0 fL   MCH 32.9 26.0 - 34.0 pg   MCHC 34.7 30.0 - 36.0 g/dL   RDW 12.4 11.5 - 15.5 %   Platelets 295 150 - 400 K/uL   Neutrophils Relative % 65 43 - 77 %   Neutro Abs 7.8 (H) 1.7 - 7.7 K/uL   Lymphocytes Relative 30 12 - 46 %   Lymphs Abs 3.6 0.7 - 4.0 K/uL   Monocytes Relative 4 3 - 12 %   Monocytes Absolute 0.5 0.1 - 1.0 K/uL   Eosinophils Relative 1 0 - 5 %   Eosinophils Absolute 0.1 0.0 - 0.7 K/uL   Basophils Relative 0 0 - 1 %   Basophils Absolute 0.0  0.0 - 0.1 K/uL  Comprehensive metabolic panel     Status: Abnormal   Collection Time: 02/13/14  2:28 PM  Result Value Ref Range   Sodium 139 135 - 145 mmol/L    Comment: Please note change in reference range.   Potassium 3.9 3.5 - 5.1 mmol/L    Comment: Please note change in reference range.   Chloride 107 96 - 112 mEq/L   CO2 24 19 - 32 mmol/L   Glucose, Bld 92 70 - 99 mg/dL   BUN <5 (L) 6 - 23 mg/dL   Creatinine, Ser 0.92 0.50 - 1.10 mg/dL   Calcium 9.0 8.4 - 10.5 mg/dL   Total Protein 6.6 6.0 - 8.3 g/dL   Albumin 4.0 3.5 - 5.2 g/dL   AST 23 0 - 37 U/L   ALT 34 0 - 35 U/L   Alkaline Phosphatase 67 39 - 117 U/L   Total Bilirubin 0.6 0.3 - 1.2 mg/dL   GFR calc non Af Amer 76 (L) >90 mL/min   GFR calc Af Amer 88 (L) >90 mL/min    Comment: (NOTE) The eGFR has been calculated using the CKD EPI equation. This calculation has not been validated in all clinical situations. eGFR's persistently <90 mL/min signify possible Chronic Kidney Disease.    Anion gap 8 5 - 15   Lipase, blood     Status: None   Collection Time: 02/13/14  2:28 PM  Result Value Ref Range   Lipase 27 11 - 59 U/L   US Abdomen Complete  02/13/2014   CLINICAL DATA:  Two-month history of right upper quadrant pain with nausea and vomiting  EXAM: ULTRASOUND ABDOMEN COMPLETE  COMPARISON:  April 15, 2008  FINDINGS: Gallbladder: Within the gallbladder, there is a 1.7 cm in length echogenic focus which moves in shadows consistent with cholelithiasis. Gallbladder wall is mildly thickened, measuring slightly greater than 4 mm in thickness. There is no appreciable pericholecystic fluid. Patient is tender over the gallbladder.  Common bile duct: Diameter: 3 mm. There is no intrahepatic, common hepatic, or common bile duct dilatation.  Liver: No focal lesion identified. Liver echogenicity overall is increased.  IVC: No abnormality visualized in visualized portions; portions obscured by gas.  Pancreas: Most of pancreas is obscured by gas.  Spleen: Size and appearance within normal limits.  Right Kidney: Length: 10.8 cm. Echogenicity within normal limits. No mass or hydronephrosis visualized.  Left Kidney: Length: 10.4 cm. Echogenicity within normal limits. No mass or hydronephrosis visualized.  Abdominal aorta: No aneurysm visualized.  Other findings: No demonstrable ascites.  IMPRESSION: Cholelithiasis with mild gallbladder wall thickening and focal tenderness over the gallbladder. Suspect early acute cholecystitis.  Most of pancreas obscured by gas. Portions of the inferior vena cava obscured by gas.  Liver echogenicity increased, a finding most likely due to hepatic steatosis. While no focal liver lesions are identified, it must be cautioned that the sensitivity of ultrasound for focal liver lesions is diminished in this circumstance.  These results will be called to the ordering clinician or representative by the Radiologist Assistant, and communication documented in the PACS or zVision Dashboard.   Electronically  Signed   By: Lowella Grip M.D.   On: 02/13/2014 12:01    Review of Systems  Constitutional: Negative.   HENT: Negative.   Eyes: Negative.   Respiratory:       Pain right upper quadrant with deep inspiration  Cardiovascular: Negative for chest pain.  Gastrointestinal: Positive for nausea, vomiting and  abdominal pain.  Genitourinary: Negative.   Musculoskeletal: Negative.   Skin: Negative.  Negative for rash.  Neurological: Negative.   Psychiatric/Behavioral: Negative.     Blood pressure 132/89, pulse 95, temperature 98.1 F (36.7 C), temperature source Oral, resp. rate 18, SpO2 96 %. Physical Exam  Constitutional: She is oriented to person, place, and time. She appears well-developed and well-nourished. No distress.  HENT:  Head: Normocephalic and atraumatic.  Right Ear: External ear normal.  Left Ear: External ear normal.  Mouth/Throat: Oropharynx is clear and moist.  Eyes: Conjunctivae and EOM are normal. Pupils are equal, round, and reactive to light.  GI: Soft. There is tenderness. There is no rebound and no guarding.  Tender in the right upper quadrant without guarding, no generalized tenderness, no peritoneal signs, bowel sounds present  Neurological: She is alert and oriented to person, place, and time.  Skin: Skin is warm and dry.  Psychiatric: She has a normal mood and affect.     Assessment/Plan Cholecystitis with cholelithiasis - admit, IV fluids, IV antibiotics. We'll plan laparoscopic cholecystectomy depending on the operative schedule over the next 24-48 hours. Procedure, risks, and benefits were discussed in detail with her and she is agreeable.  Alinda Egolf E 02/13/2014, 5:49 PM

## 2014-02-14 ENCOUNTER — Encounter (HOSPITAL_COMMUNITY): Payer: Self-pay | Admitting: Certified Registered"

## 2014-02-14 ENCOUNTER — Inpatient Hospital Stay (HOSPITAL_COMMUNITY): Payer: 59 | Admitting: Certified Registered"

## 2014-02-14 ENCOUNTER — Encounter (HOSPITAL_COMMUNITY): Admission: EM | Disposition: A | Discharge status: Home / Self Care | Payer: Self-pay | Source: Home / Self Care

## 2014-02-14 HISTORY — PX: CHOLECYSTECTOMY: SHX55

## 2014-02-14 LAB — COMPREHENSIVE METABOLIC PANEL
ALBUMIN: 3.4 g/dL — AB (ref 3.5–5.2)
ALT: 29 U/L (ref 0–35)
AST: 20 U/L (ref 0–37)
Alkaline Phosphatase: 62 U/L (ref 39–117)
Anion gap: 11 (ref 5–15)
BILIRUBIN TOTAL: 0.8 mg/dL (ref 0.3–1.2)
CHLORIDE: 108 meq/L (ref 96–112)
CO2: 22 mmol/L (ref 19–32)
Calcium: 8.6 mg/dL (ref 8.4–10.5)
Creatinine, Ser: 0.98 mg/dL (ref 0.50–1.10)
GFR calc Af Amer: 81 mL/min — ABNORMAL LOW (ref >90)
GFR, EST NON AFRICAN AMERICAN: 70 mL/min — AB (ref >90)
Glucose, Bld: 99 mg/dL (ref 70–99)
Potassium: 4.3 mmol/L (ref 3.5–5.1)
SODIUM: 141 mmol/L (ref 135–145)
Total Protein: 6 g/dL (ref 6.0–8.3)

## 2014-02-14 LAB — CBC
HEMATOCRIT: 38.9 % (ref 36.0–46.0)
Hemoglobin: 13.1 g/dL (ref 12.0–15.0)
MCH: 32.4 pg (ref 26.0–34.0)
MCHC: 33.7 g/dL (ref 30.0–36.0)
MCV: 96.3 fL (ref 78.0–100.0)
PLATELETS: 265 10*3/uL (ref 150–400)
RBC: 4.04 MIL/uL (ref 3.87–5.11)
RDW: 12.6 % (ref 11.5–15.5)
WBC: 6.8 10*3/uL (ref 4.0–10.5)

## 2014-02-14 SURGERY — LAPAROSCOPIC CHOLECYSTECTOMY WITH INTRAOPERATIVE CHOLANGIOGRAM
Anesthesia: General

## 2014-02-14 MED ORDER — MIDAZOLAM HCL 5 MG/5ML IJ SOLN
INTRAMUSCULAR | Status: DC | PRN
  Administered 2014-02-14: 2 mg via INTRAVENOUS

## 2014-02-14 MED ORDER — BUPIVACAINE-EPINEPHRINE 0.25% -1:200000 IJ SOLN
INTRAMUSCULAR | Status: DC | PRN
  Administered 2014-02-14: 6 mL

## 2014-02-14 MED ORDER — FENTANYL CITRATE 0.05 MG/ML IJ SOLN
INTRAMUSCULAR | Status: AC
  Filled 2014-02-14: qty 5

## 2014-02-14 MED ORDER — SODIUM CHLORIDE 0.9 % IR SOLN
Status: DC | PRN
  Administered 2014-02-14: 1000 mL

## 2014-02-14 MED ORDER — ONDANSETRON HCL 4 MG/2ML IJ SOLN: 4.0000 mg | Freq: Once | INTRAMUSCULAR | Status: DC | PRN

## 2014-02-14 MED ORDER — SUCCINYLCHOLINE CHLORIDE 20 MG/ML IJ SOLN
INTRAMUSCULAR | Status: AC
  Filled 2014-02-14: qty 1

## 2014-02-14 MED ORDER — LACTATED RINGERS IV SOLN
INTRAVENOUS | Status: DC | PRN
  Administered 2014-02-14: 14:00:00 via INTRAVENOUS

## 2014-02-14 MED ORDER — HYDROMORPHONE HCL 1 MG/ML IJ SOLN: 1.0000 mg | INTRAMUSCULAR | Status: DC | PRN

## 2014-02-14 MED ORDER — NEOSTIGMINE METHYLSULFATE 10 MG/10ML IV SOLN
INTRAVENOUS | Status: DC | PRN
  Administered 2014-02-14: 4 mg via INTRAVENOUS

## 2014-02-14 MED ORDER — OXYCODONE-ACETAMINOPHEN 5-325 MG PO TABS
1.0000 | ORAL_TABLET | ORAL | Status: DC | PRN
  Administered 2014-02-14: 2 via ORAL
  Administered 2014-02-15: 1 via ORAL
  Administered 2014-02-15 (×2): 2 via ORAL
  Filled 2014-02-14 (×2): qty 2
  Filled 2014-02-14: qty 1
  Filled 2014-02-14: qty 2

## 2014-02-14 MED ORDER — OXYCODONE HCL 5 MG PO TABS: 5.0000 mg | ORAL_TABLET | Freq: Once | ORAL | Status: DC | PRN

## 2014-02-14 MED ORDER — PHENYLEPHRINE HCL 10 MG/ML IJ SOLN
INTRAMUSCULAR | Status: DC | PRN
  Administered 2014-02-14: 80 ug via INTRAVENOUS

## 2014-02-14 MED ORDER — 0.9 % SODIUM CHLORIDE (POUR BTL) OPTIME
TOPICAL | Status: DC | PRN
  Administered 2014-02-14: 2000 mL

## 2014-02-14 MED ORDER — ONDANSETRON HCL 4 MG/2ML IJ SOLN
INTRAMUSCULAR | Status: DC | PRN
  Administered 2014-02-14 (×2): 4 mg via INTRAVENOUS

## 2014-02-14 MED ORDER — PROPOFOL 10 MG/ML IV BOLUS
INTRAVENOUS | Status: DC | PRN
  Administered 2014-02-14: 150 mg via INTRAVENOUS

## 2014-02-14 MED ORDER — GLYCOPYRROLATE 0.2 MG/ML IJ SOLN
INTRAMUSCULAR | Status: DC | PRN
  Administered 2014-02-14: 0.6 mg via INTRAVENOUS

## 2014-02-14 MED ORDER — MIDAZOLAM HCL 2 MG/2ML IJ SOLN
INTRAMUSCULAR | Status: AC
  Filled 2014-02-14: qty 2

## 2014-02-14 MED ORDER — HYDROMORPHONE HCL 1 MG/ML IJ SOLN
0.2500 mg | INTRAMUSCULAR | Status: DC | PRN
  Administered 2014-02-14: 0.5 mg via INTRAVENOUS

## 2014-02-14 MED ORDER — ROCURONIUM BROMIDE 100 MG/10ML IV SOLN
INTRAVENOUS | Status: DC | PRN
  Administered 2014-02-14: 35 mg via INTRAVENOUS

## 2014-02-14 MED ORDER — LIDOCAINE HCL (CARDIAC) 20 MG/ML IV SOLN
INTRAVENOUS | Status: DC | PRN
  Administered 2014-02-14: 60 mg via INTRAVENOUS

## 2014-02-14 MED ORDER — PROPOFOL 10 MG/ML IV BOLUS
INTRAVENOUS | Status: AC
  Filled 2014-02-14: qty 20

## 2014-02-14 MED ORDER — FENTANYL CITRATE 0.05 MG/ML IJ SOLN
INTRAMUSCULAR | Status: DC | PRN
  Administered 2014-02-14 (×3): 50 ug via INTRAVENOUS
  Administered 2014-02-14: 25 ug via INTRAVENOUS

## 2014-02-14 MED ORDER — BUPIVACAINE-EPINEPHRINE (PF) 0.25% -1:200000 IJ SOLN
INTRAMUSCULAR | Status: AC
  Filled 2014-02-14: qty 30

## 2014-02-14 MED ORDER — ROCURONIUM BROMIDE 50 MG/5ML IV SOLN
INTRAVENOUS | Status: AC
  Filled 2014-02-14: qty 1

## 2014-02-14 MED ORDER — OXYCODONE HCL 5 MG/5ML PO SOLN: 5.0000 mg | Freq: Once | ORAL | Status: DC | PRN

## 2014-02-14 MED ORDER — DEXAMETHASONE SODIUM PHOSPHATE 10 MG/ML IJ SOLN
INTRAMUSCULAR | Status: DC | PRN
  Administered 2014-02-14: 8 mg via INTRAVENOUS

## 2014-02-14 MED ORDER — HYDROMORPHONE HCL 1 MG/ML IJ SOLN
INTRAMUSCULAR | Status: AC
  Filled 2014-02-14: qty 1

## 2014-02-14 SURGICAL SUPPLY — 40 items
APPLIER CLIP ROT 10 11.4 M/L (STAPLE) ×3
BLADE SURG ROTATE 9660 (MISCELLANEOUS) IMPLANT
CANISTER SUCTION 2500CC (MISCELLANEOUS) ×3 IMPLANT
CHLORAPREP W/TINT 26ML (MISCELLANEOUS) ×3 IMPLANT
CLIP APPLIE ROT 10 11.4 M/L (STAPLE) ×1 IMPLANT
COVER MAYO STAND STRL (DRAPES) ×3 IMPLANT
COVER SURGICAL LIGHT HANDLE (MISCELLANEOUS) ×3 IMPLANT
DERMABOND ADHESIVE PROPEN (GAUZE/BANDAGES/DRESSINGS) ×2
DERMABOND ADVANCED .7 DNX6 (GAUZE/BANDAGES/DRESSINGS) ×1 IMPLANT
DRAPE C-ARM 42X72 X-RAY (DRAPES) ×3 IMPLANT
DRAPE LAPAROSCOPIC ABDOMINAL (DRAPES) ×3 IMPLANT
DRAPE WARM FLUID 44X44 (DRAPE) ×3 IMPLANT
ELECT REM PT RETURN 9FT ADLT (ELECTROSURGICAL) ×3
ELECTRODE REM PT RTRN 9FT ADLT (ELECTROSURGICAL) ×1 IMPLANT
GLOVE BIO SURGEON STRL SZ8 (GLOVE) ×3 IMPLANT
GLOVE BIOGEL PI IND STRL 8 (GLOVE) ×1 IMPLANT
GLOVE BIOGEL PI INDICATOR 8 (GLOVE) ×2
GOWN STRL REUS W/ TWL LRG LVL3 (GOWN DISPOSABLE) ×2 IMPLANT
GOWN STRL REUS W/ TWL XL LVL3 (GOWN DISPOSABLE) ×1 IMPLANT
GOWN STRL REUS W/TWL LRG LVL3 (GOWN DISPOSABLE) ×4
GOWN STRL REUS W/TWL XL LVL3 (GOWN DISPOSABLE) ×2
KIT BASIN OR (CUSTOM PROCEDURE TRAY) ×3 IMPLANT
KIT ROOM TURNOVER OR (KITS) ×3 IMPLANT
LIQUID BAND (GAUZE/BANDAGES/DRESSINGS) ×3 IMPLANT
NS IRRIG 1000ML POUR BTL (IV SOLUTION) ×3 IMPLANT
PAD ARMBOARD 7.5X6 YLW CONV (MISCELLANEOUS) ×3 IMPLANT
POUCH SPECIMEN RETRIEVAL 10MM (ENDOMECHANICALS) ×3 IMPLANT
SCISSORS LAP 5X35 DISP (ENDOMECHANICALS) ×3 IMPLANT
SET CHOLANGIOGRAPH 5 50 .035 (SET/KITS/TRAYS/PACK) ×3 IMPLANT
SET IRRIG TUBING LAPAROSCOPIC (IRRIGATION / IRRIGATOR) ×3 IMPLANT
SLEEVE ENDOPATH XCEL 5M (ENDOMECHANICALS) ×3 IMPLANT
SPECIMEN JAR SMALL (MISCELLANEOUS) ×3 IMPLANT
SUT MNCRL AB 4-0 PS2 18 (SUTURE) ×3 IMPLANT
TOWEL OR 17X24 6PK STRL BLUE (TOWEL DISPOSABLE) ×3 IMPLANT
TOWEL OR 17X26 10 PK STRL BLUE (TOWEL DISPOSABLE) ×3 IMPLANT
TRAY LAPAROSCOPIC (CUSTOM PROCEDURE TRAY) ×3 IMPLANT
TROCAR XCEL BLUNT TIP 100MML (ENDOMECHANICALS) ×3 IMPLANT
TROCAR XCEL NON-BLD 11X100MML (ENDOMECHANICALS) ×3 IMPLANT
TROCAR XCEL NON-BLD 5MMX100MML (ENDOMECHANICALS) ×3 IMPLANT
TUBING INSUFFLATION (TUBING) ×3 IMPLANT

## 2014-02-14 NOTE — Op Note (Signed)
Laparoscopic Cholecystectomy Procedure Note  Indications: This patient presents with symptomatic gallbladder disease and will undergo laparoscopic cholecystectomy.The procedure has been discussed with the patient. Operative and non operative treatments have been discussed. Risks of surgery include bleeding, infection,  Common bile duct injury,  Injury to the stomach,liver, colon,small intestine, abdominal wall,  Diaphragm,  Major blood vessels,  And the need for an open procedure.  Other risks include worsening of medical problems, death,  DVT and pulmonary embolism, and cardiovascular events.   Medical options have also been discussed. The patient has been informed of long term expectations of surgery and non surgical options,  The patient agrees to proceed.    Pre-operative Diagnosis: Acute and chronic cholecystitis  Post-operative Diagnosis: Same  Surgeon: Romonda Parker A.   Assistants: none  Anesthesia: General endotracheal anesthesia and Local anesthesia 0.25.% bupivacaine, with epinephrine  ASA Class: 2  Procedure Details  The patient was seen again in the Holding Room. The risks, benefits, complications, treatment options, and expected outcomes were discussed with the patient. The possibilities of reaction to medication, pulmonary aspiration, perforation of viscus, bleeding, recurrent infection, finding a normal gallbladder, the need for additional procedures, failure to diagnose a condition, the possible need to convert to an open procedure, and creating a complication requiring transfusion or operation were discussed with the patient. The patient and/or family concurred with the proposed plan, giving informed consent. The site of surgery properly noted/marked. The patient was taken to Operating Room, identified as Valerie Merritt and the procedure verified as Laparoscopic Cholecystectomy with Intraoperative Cholangiograms. A Time Out was held and the above information confirmed.  Prior  to the induction of general anesthesia, antibiotic prophylaxis was administered. General endotracheal anesthesia was then administered and tolerated well. After the induction, the abdomen was prepped in the usual sterile fashion. The patient was positioned in the supine position with the left arm comfortably tucked, along with some reverse Trendelenburg.  Local anesthetic agent was injected into the skin near the umbilicus and an incision made. The midline fascia was incised and the Hasson technique was used to introduce a 12 mm port under direct vision. It was secured with a figure of eight Vicryl suture placed in the usual fashion. Pneumoperitoneum was then created with CO2 and tolerated well without any adverse changes in the patient's vital signs. Additional trocars were introduced under direct vision with an 11 mm trocar in the epigastrium and two  5 mm trocars in the right upper quadrant. All skin incisions were infiltrated with a local anesthetic agent before making the incision and placing the trocars.   The gallbladder was identified, the fundus grasped and retracted cephalad. Adhesions were lysed bluntly and with the electrocautery where indicated, taking care not to injure any adjacent organs or viscus. The infundibulum was grasped and retracted laterally, exposing the peritoneum overlying the triangle of Calot. This was then divided and exposed in a blunt fashion. The cystic duct was clearly identified and bluntly dissected circumferentially. The junctions of the gallbladder, cystic duct and common bile duct were clearly identified prior to the division of any linear structure.   Pt has a contrast allergy so IOC was not done. The critical view was obtained.   The cystic duct was then  ligated with surgical clips  on the patient side and  clipped on the gallbladder side and divided. The cystic artery was identified, dissected free, ligated with clips and divided as well. Posterior cystic artery  clipped and divided.  The gallbladder was dissected  from the liver bed in retrograde fashion with the electrocautery. The gallbladder was removed with endocatch bag.  . The liver bed was irrigated and inspected. Hemostasis was achieved with the electrocautery. Copious irrigation was utilized and was repeatedly aspirated until clear all particulate matter. Hemostasis was achieved with no signs  Of bleeding or bile leakage.  Pneumoperitoneum was completely reduced after viewing removal of the trocars under direct vision. The wound was thoroughly irrigated and the fascia was then closed with a figure of eight suture; the skin was then closed with 4 O monocryl.  and a sterile dressing was applied.  Instrument, sponge, and needle counts were correct at closure and at the conclusion of the case.   Findings: Cholecystitis with Cholelithiasis  Estimated Blood Loss: Minimal         Drains: none         Total IV Fluids: 600 mL         Specimens: Gallbladder           Complications: None; patient tolerated the procedure well.         Disposition: PACU - hemodynamically stable.         Condition: stable

## 2014-02-14 NOTE — Transfer of Care (Signed)
Immediate Anesthesia Transfer of Care Note  Patient: Valerie Merritt  Procedure(s) Performed: Procedure(s): LAPAROSCOPIC CHOLECYSTECTOMY (N/A)  Patient Location: PACU  Anesthesia Type:General  Level of Consciousness: awake, alert  and oriented  Airway & Oxygen Therapy: Patient connected to face mask  Post-op Assessment: Report given to PACU RN  Post vital signs: stable  Complications: No apparent anesthesia complications

## 2014-02-14 NOTE — Anesthesia Postprocedure Evaluation (Signed)
  Anesthesia Post-op Note  Patient: Valerie Merritt  Procedure(s) Performed: Procedure(s): LAPAROSCOPIC CHOLECYSTECTOMY (N/A)  Patient Location: PACU  Anesthesia Type:General  Level of Consciousness: awake, alert  and oriented  Airway and Oxygen Therapy: Patient Spontanous Breathing and Patient connected to nasal cannula oxygen  Post-op Pain: mild  Post-op Assessment: Post-op Vital signs reviewed, Patient's Cardiovascular Status Stable, Respiratory Function Stable, Patent Airway, No signs of Nausea or vomiting and Pain level controlled  Post-op Vital Signs: stable  Last Vitals:  Filed Vitals:   02/14/14 2115  BP: 118/73  Pulse: 97  Temp: 37 C  Resp: 16    Complications: No apparent anesthesia complications

## 2014-02-14 NOTE — Progress Notes (Signed)
Day of Surgery  Subjective: She looks fine and feels better.  Pain in back and RUQ if you press on it.  Objective: Vital signs in last 24 hours: Temp:  [98 F (36.7 C)-98.2 F (36.8 C)] 98 F (36.7 C) (01/01 0527) Pulse Rate:  [69-95] 69 (01/01 0527) Resp:  [16-18] 18 (12/31 1425) BP: (111-148)/(75-100) 124/77 mmHg (01/01 0527) SpO2:  [96 %-100 %] 96 % (01/01 0527) Weight:  [83.462 kg (184 lb)-84.369 kg (186 lb)] 83.462 kg (184 lb) (12/31 1931) Last BM Date: 02/13/14 Afebrile, VSS Labs OK, WBC back to normal NPO Intake/Output from previous day: 12/31 0701 - 01/01 0700 In: 915 [I.V.:915] Out: -  Intake/Output this shift:    General appearance: alert, cooperative and no distress GI: soft tender RUQ and back  Lab Results:   Recent Labs  02/13/14 1428 02/14/14 0406  WBC 12.0* 6.8  HGB 14.3 13.1  HCT 41.2 38.9  PLT 295 265    BMET  Recent Labs  02/13/14 1428 02/14/14 0406  NA 139 141  K 3.9 4.3  CL 107 108  CO2 24 22  GLUCOSE 92 99  BUN <5* <5*  CREATININE 0.92 0.98  CALCIUM 9.0 8.6   PT/INR No results for input(s): LABPROT, INR in the last 72 hours.   Recent Labs Lab 02/13/14 1036 02/13/14 1428 02/14/14 0406  AST ALT 34 34 29  ALKPHOS 81 67 62  BILITOT 0.4 0.6 0.8  PROT 7.1 6.6 6.0  ALBUMIN 4.8 4.0 3.4*     Lipase     Component Value Date/Time   LIPASE 27 02/13/2014 1428     Studies/Results: US Abdomen Complete  02/13/2014   CLINICAL DATA:  Two-month history of right upper quadrant pain with nausea and vomiting  EXAM: ULTRASOUND ABDOMEN COMPLETE  COMPARISON:  April 15, 2008  FINDINGS: Gallbladder: Within the gallbladder, there is a 1.7 cm in length echogenic focus which moves in shadows consistent with cholelithiasis. Gallbladder wall is mildly thickened, measuring slightly greater than 4 mm in thickness. There is no appreciable pericholecystic fluid. Patient is tender over the gallbladder.  Common bile duct: Diameter: 3 mm. There  is no intrahepatic, common hepatic, or common bile duct dilatation.  Liver: No focal lesion identified. Liver echogenicity overall is increased.  IVC: No abnormality visualized in visualized portions; portions obscured by gas.  Pancreas: Most of pancreas is obscured by gas.  Spleen: Size and appearance within normal limits.  Right Kidney: Length: 10.8 cm. Echogenicity within normal limits. No mass or hydronephrosis visualized.  Left Kidney: Length: 10.4 cm. Echogenicity within normal limits. No mass or hydronephrosis visualized.  Abdominal aorta: No aneurysm visualized.  Other findings: No demonstrable ascites.  IMPRESSION: Cholelithiasis with mild gallbladder wall thickening and focal tenderness over the gallbladder. Suspect early acute cholecystitis.  Most of pancreas obscured by gas. Portions of the inferior vena cava obscured by gas.  Liver echogenicity increased, a finding most likely due to hepatic steatosis. While no focal liver lesions are identified, it must be cautioned that the sensitivity of ultrasound for focal liver lesions is diminished in this circumstance.  These results will be called to the ordering clinician or representative by the Radiologist Assistant, and communication documented in the PACS or zVision Dashboard.   Electronically Signed   By: Bretta Bang M.D.   On: 02/13/2014 12:01    Medications: . citalopram  40 mg Oral QHS  . clindamycin (CLEOCIN) IV  600 mg Intravenous 3 times per  day  . nicotine  21 mg Transdermal Daily  . pantoprazole (PROTONIX) IV  40 mg Intravenous QHS    Assessment/Plan Cholelithiasis/cholecystitis Anxiety TJM Obesity   For OR later today.    LOS: 1 day    Valerie Merritt 02/14/2014

## 2014-02-14 NOTE — Anesthesia Procedure Notes (Signed)
Procedure Name: Intubation Date/Time: 02/14/2014 1:46 PM Performed by: Ellin Goodie Pre-anesthesia Checklist: Patient identified, Emergency Drugs available, Suction available, Patient being monitored and Timeout performed Patient Re-evaluated:Patient Re-evaluated prior to inductionOxygen Delivery Method: Circle system utilized Preoxygenation: Pre-oxygenation with 100% oxygen Intubation Type: IV induction Ventilation: Mask ventilation without difficulty Laryngoscope Size: Mac and 3 Grade View: Grade I Tube type: Oral Tube size: 7.5 mm Number of attempts: 1 Airway Equipment and Method: Stylet Placement Confirmation: ETT inserted through vocal cords under direct vision,  positive ETCO2 and breath sounds checked- equal and bilateral Secured at: 21 cm Tube secured with: Tape Dental Injury: Teeth and Oropharynx as per pre-operative assessment  Comments: Easy atraumatic induction and intubation with MAC 3 blade.  Dr Noreene Larsson verified placement.  Carlynn Herald, CRNA

## 2014-02-14 NOTE — Anesthesia Preprocedure Evaluation (Addendum)
Anesthesia Evaluation  Patient identified by MRN, date of birth, ID band Patient awake    Reviewed: Allergy & Precautions, H&P , NPO status , Patient's Chart, lab work & pertinent test results  Airway Mallampati: I  TM Distance: >3 FB     Dental  (+) Teeth Intact, Dental Advisory Given   Pulmonary pneumonia -, resolved, Current Smoker,  breath sounds clear to auscultation        Cardiovascular Rhythm:Regular     Neuro/Psych  Headaches, Anxiety    GI/Hepatic negative GI ROS, Neg liver ROS,   Endo/Other  negative endocrine ROS  Renal/GU negative Renal ROS     Musculoskeletal negative musculoskeletal ROS (+)   Abdominal (+)  Abdomen: soft. Bowel sounds: normal.  Peds  Hematology negative hematology ROS (+)   Anesthesia Other Findings   Reproductive/Obstetrics negative OB ROS                           Anesthesia Physical Anesthesia Plan  ASA: II  Anesthesia Plan:    Post-op Pain Management:    Induction:   Airway Management Planned:   Additional Equipment:   Intra-op Plan:   Post-operative Plan:   Informed Consent:   Plan Discussed with:   Anesthesia Plan Comments:         Anesthesia Quick Evaluation

## 2014-02-15 MED ORDER — OXYCODONE-ACETAMINOPHEN 5-325 MG PO TABS: 1.0000 | ORAL_TABLET | ORAL | Status: DC | PRN

## 2014-02-15 MED ORDER — PANTOPRAZOLE SODIUM 40 MG PO TBEC: 40.0000 mg | DELAYED_RELEASE_TABLET | Freq: Every day | ORAL | Status: DC

## 2014-02-15 MED ORDER — ACETAMINOPHEN 325 MG PO TABS: 650.0000 mg | ORAL_TABLET | Freq: Four times a day (QID) | ORAL | Status: DC | PRN

## 2014-02-15 NOTE — Progress Notes (Signed)
1 Day Post-Op  Subjective: She feels great and wants to go home.  I told her not to go to work till Monday.    Objective: Vital signs in last 24 hours: Temp:  [97 F (36.1 C)-98.6 F (37 C)] 98.6 F (37 C) (01/02 0604) Pulse Rate:  [64-97] 72 (01/02 0604) Resp:  [12-20] 17 (01/02 0604) BP: (110-145)/(64-96) 132/74 mmHg (01/02 0604) SpO2:  [91 %-100 %] 96 % (01/02 0604) Last BM Date: 02/13/14 240 Po BP 141/90 earlier down this Am Labs OK Yesterday AM Soft diet Intake/Output from previous day: 01/01 0701 - 01/02 0700 In: 1610 [P.O.:240; I.V.:1370] Out: -  Intake/Output this shift:    General appearance: alert, cooperative and no distress Resp: clear to auscultation bilaterally GI: soft sore port sites look fine.  Lab Results:   Recent Labs  02/13/14 1428 02/14/14 0406  WBC 12.0* 6.8  HGB 14.3 13.1  HCT 41.2 38.9  PLT 295 265    BMET  Recent Labs  02/13/14 1428 02/14/14 0406  NA 139 141  K 3.9 4.3  CL 107 108  CO2 24 22  GLUCOSE 92 99  BUN <5* <5*  CREATININE 0.92 0.98  CALCIUM 9.0 8.6   PT/INR No results for input(s): LABPROT, INR in the last 72 hours.   Recent Labs Lab 02/13/14 1036 02/13/14 1428 02/14/14 0406  AST ALT 34 34 29  ALKPHOS 81 67 62  BILITOT 0.4 0.6 0.8  PROT 7.1 6.6 6.0  ALBUMIN 4.8 4.0 3.4*     Lipase     Component Value Date/Time   LIPASE 27 02/13/2014 1428     Studies/Results: US Abdomen Complete  02/13/2014   CLINICAL DATA:  Two-month history of right upper quadrant pain with nausea and vomiting  EXAM: ULTRASOUND ABDOMEN COMPLETE  COMPARISON:  April 15, 2008  FINDINGS: Gallbladder: Within the gallbladder, there is a 1.7 cm in length echogenic focus which moves in shadows consistent with cholelithiasis. Gallbladder wall is mildly thickened, measuring slightly greater than 4 mm in thickness. There is no appreciable pericholecystic fluid. Patient is tender over the gallbladder.  Common bile duct: Diameter: 3  mm. There is no intrahepatic, common hepatic, or common bile duct dilatation.  Liver: No focal lesion identified. Liver echogenicity overall is increased.  IVC: No abnormality visualized in visualized portions; portions obscured by gas.  Pancreas: Most of pancreas is obscured by gas.  Spleen: Size and appearance within normal limits.  Right Kidney: Length: 10.8 cm. Echogenicity within normal limits. No mass or hydronephrosis visualized.  Left Kidney: Length: 10.4 cm. Echogenicity within normal limits. No mass or hydronephrosis visualized.  Abdominal aorta: No aneurysm visualized.  Other findings: No demonstrable ascites.  IMPRESSION: Cholelithiasis with mild gallbladder wall thickening and focal tenderness over the gallbladder. Suspect early acute cholecystitis.  Most of pancreas obscured by gas. Portions of the inferior vena cava obscured by gas.  Liver echogenicity increased, a finding most likely due to hepatic steatosis. While no focal liver lesions are identified, it must be cautioned that the sensitivity of ultrasound for focal liver lesions is diminished in this circumstance.  These results will be called to the ordering clinician or representative by the Radiologist Assistant, and communication documented in the PACS or zVision Dashboard.   Electronically Signed   By: Bretta Bang M.D.   On: 02/13/2014 12:01    Medications: . citalopram  40 mg Oral QHS  . clindamycin (CLEOCIN) IV  600 mg Intravenous 3 times  per day  . nicotine  21 mg Transdermal Daily  . pantoprazole  40 mg Oral Q1200    Assessment/Plan Acute and chronic cholecystitis Laparoscopic Cholecystectomy  02/14/14 Dr. Luisa Hart  Anxiety  TMJ Obesity   Plan:  Home today.   LOS: 2 days    Dominique Calvey 02/15/2014

## 2014-02-15 NOTE — Discharge Instructions (Signed)
Laparoscopic Cholecystectomy, Care After °Refer to this sheet in the next few weeks. These instructions provide you with information on caring for yourself after your procedure. Your health care provider may also give you more specific instructions. Your treatment has been planned according to current medical practices, but problems sometimes occur. Call your health care provider if you have any problems or questions after your procedure. °WHAT TO EXPECT AFTER THE PROCEDURE °After your procedure, it is typical to have the following: °· Pain at your incision sites. You will be given pain medicines to control the pain. °· Mild nausea or vomiting. This should improve after the first 24 hours. °· Bloating and possibly shoulder pain from the gas used during the procedure. This will improve after the first 24 hours. °HOME CARE INSTRUCTIONS  °· Change bandages (dressings) as directed by your health care provider. °· Keep the wound dry and clean. You may wash the wound gently with soap and water. Gently blot or dab the area dry. °· Do not take baths or use swimming pools or hot tubs for 2 weeks or until your health care provider approves. °· Only take over-the-counter or prescription medicines as directed by your health care provider. °· Continue your normal diet as directed by your health care provider. °· Do not lift anything heavier than 10 pounds (4.5 kg) until your health care provider approves. °· Do not play contact sports for 1 week or until your health care provider approves. °SEEK MEDICAL CARE IF:  °· You have redness, swelling, or increasing pain in the wound. °· You notice yellowish-white fluid (pus) coming from the wound. °· You have drainage from the wound that lasts longer than 1 day. °· You notice a bad smell coming from the wound or dressing. °· Your surgical cuts (incisions) break open. °SEEK IMMEDIATE MEDICAL CARE IF:  °· You develop a rash. °· You have difficulty breathing. °· You have chest pain. °· You  have a fever. °· You have increasing pain in the shoulders (shoulder strap areas). °· You have dizzy episodes or faint while standing. °· You have severe abdominal pain. °· You feel sick to your stomach (nauseous) or throw up (vomit) and this lasts for more than 1 day. °Document Released: 01/31/2005 Document Revised: 11/21/2012 Document Reviewed: 09/12/2012 °ExitCare® Patient Information ©2015 [employer]. This information is not intended to replace advice given to you by your health care provider. Make sure you discuss any questions you have with your health care provider. ° °[facility] ______[facility] °LAPAROSCOPIC SURGERY: POST OP INSTRUCTIONS °Always review your discharge instruction sheet given to you by the facility where your surgery was performed. °IF YOU HAVE DISABILITY OR FAMILY LEAVE FORMS, YOU MUST BRING THEM TO THE OFFICE FOR PROCESSING.   °DO NOT GIVE THEM TO YOUR DOCTOR. ° °1. A prescription for pain medication may be given to you upon discharge.  Take your pain medication as prescribed, if needed.  If narcotic pain medicine is not needed, then you may take acetaminophen (Tylenol) or ibuprofen (Advil) as needed. °2. Take your usually prescribed medications unless otherwise directed. °3. If you need a refill on your pain medication, please contact your pharmacy.  They will contact our office to request authorization. Prescriptions will not be filled after 5pm or on week-ends. °4. You should follow a light diet the first few days after arrival home, such as soup and crackers, etc.  Be sure to include lots of fluids daily. °5. Most patients will experience some   swelling and bruising in the area of the incisions.  Ice packs will help.  Swelling and bruising can take several days to resolve.  °6. It is common to experience some constipation if taking pain medication after surgery.  Increasing fluid intake and taking a stool softener (such as Colace) will usually help or prevent this problem  from occurring.  A mild laxative (Milk of Magnesia or Miralax) should be taken according to package instructions if there are no bowel movements after 48 hours. °7. Unless discharge instructions indicate otherwise, you may remove your bandages 24-48 hours after surgery, and you may shower at that time.  You may have steri-strips (small skin tapes) in place directly over the incision.  These strips should be left on the skin for 7-10 days.  If your surgeon used skin glue on the incision, you may shower in 24 hours.  The glue will flake off over the next 2-3 weeks.  Any sutures or staples will be removed at the office during your follow-up visit. °8. ACTIVITIES:  You may resume regular (light) daily activities beginning the next day--such as daily self-care, walking, climbing stairs--gradually increasing activities as tolerated.  You may have sexual intercourse when it is comfortable.  Refrain from any heavy lifting or straining until approved by your doctor. °a. You may drive when you are no longer taking prescription pain medication, you can comfortably wear a seatbelt, and you can safely maneuver your car and apply brakes. °b. RETURN TO WORK:  __________________________________________________________ °9. You should see your doctor in the office for a follow-up appointment approximately 2-3 weeks after your surgery.  Make sure that you call for this appointment within a day or two after you arrive home to insure a convenient appointment time. °10. OTHER INSTRUCTIONS: __________________________________________________________________________________________________________________________ __________________________________________________________________________________________________________________________ °WHEN TO CALL YOUR DOCTOR: °1. Fever over 101.0 °2. Inability to urinate °3. Continued bleeding from incision. °4. Increased pain, redness, or drainage from the incision. °5. Increasing abdominal pain ° °The  clinic staff is available to answer your questions during regular business hours.  Please don’t hesitate to call and ask to speak to one of the nurses for clinical concerns.  If you have a medical emergency, go to the nearest emergency room or call 911.  A surgeon from [facility] is always on call at the hospital. °[address], [address], [place], [place]  [address] ? [address] [address], [place], [place]   [address] °[phone] ? [phone] ? FAX [phone] °Web site: [email or web address] °

## 2014-02-17 ENCOUNTER — Encounter (HOSPITAL_COMMUNITY): Payer: Self-pay | Admitting: Surgery

## 2014-02-19 NOTE — Discharge Summary (Signed)
Physician Discharge Summary  Patient ID: Valerie Merritt MRN: [id number] DOB/AGE: 1970/05/24 44 y.o.  Admit date: 02/13/2014 Discharge date: 02/19/2014  Admission Diagnoses: * Cholecystitis with cholelithiasis  Anxiety  TMJ Obesity  Discharge Diagnoses:  SAME  Active Problems:   Cholecystitis with cholelithiasis   PROCEDURES: Laparoscopic Cholecystectomy 02/14/14 Dr. 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 She has been having episodic right upper quadrant pain extending to her back over the past several months. Over the past 2 weeks she has had 2 or 3 attacks. Today it was much worse and she came to the emergency department. Ultrasound reveals cholelithiasis with signs of cholecystitis. Her pain is improved somewhat after receiving medication. She has a mild leukocytosis. We are asked to see her for admission and treatment She was admitted, placed on IV fluids, antibiotics and taken to the OR the following day by Dr. Luisa Hartornett.  She tolerated the procedure well and was ready for discharge the following day.    Condition on d/c:  Improved.  Disposition: 01-Home or Self Care     Medication List    TAKE these medications        acetaminophen 325 MG tablet  Commonly known as:  TYLENOL  Take 2 tablets (650 mg total) by mouth every 6 (six) hours as needed for mild pain, moderate pain, fever or headache.     cholecalciferol 1000 UNITS tablet  Commonly known as:  VITAMIN D  Take 1,000 Units by mouth daily.     citalopram 40 MG tablet  Commonly known as:  CELEXA  Take 1 tablet (40 mg total) by mouth at bedtime.     doxycycline 100 MG tablet  Commonly known as:  VIBRA-TABS  Take 100 mg by mouth 2 (two) times daily.     ibuprofen 200 MG tablet  Commonly known as:  ADVIL,MOTRIN  Take 600 mg by mouth every 6 (six) hours as needed.     oxyCODONE-acetaminophen 5-325 MG per tablet  Commonly known as:  PERCOCET/ROXICET  Take  1-2 tablets by mouth every 4 (four) hours as needed for severe pain.     zaleplon 10 MG capsule  Commonly known as:  SONATA  Take 1 capsule (10 mg total) by mouth at bedtime.           Follow-up Information    Follow up with [facility] [place].   Why:  Call Monday and ask for a Doc of the week follow up appointment for 3 weeks.   Contact information:   [address]   [place] [address] [phone]       [facility]   Specialty:  Internal Medicine   Why:  Call and let her know you had surgery and how you are doing.  follow up with her for medical issues/sinus infection.   Contact information:   [address] [place] [id number] [phone]       Signed: Sherrie GeorgeJENNINGS,Briunna Leicht 02/19/2014, 4:37 PM

## 2014-03-03 ENCOUNTER — Ambulatory Visit (HOSPITAL_BASED_OUTPATIENT_CLINIC_OR_DEPARTMENT_OTHER): Payer: 59

## 2014-03-07 ENCOUNTER — Encounter: Payer: Commercial Managed Care - PPO | Admitting: Internal Medicine

## 2014-04-14 ENCOUNTER — Other Ambulatory Visit: Payer: Self-pay | Admitting: *Deleted

## 2014-04-14 DIAGNOSIS — F411 Generalized anxiety disorder: Secondary | ICD-10-CM

## 2014-04-14 MED ORDER — CITALOPRAM HYDROBROMIDE 40 MG PO TABS: 40.0000 mg | ORAL_TABLET | Freq: Every day | ORAL | Status: DC

## 2014-04-14 NOTE — Telephone Encounter (Signed)
Refill request

## 2014-05-01 ENCOUNTER — Encounter: Payer: Commercial Managed Care - PPO | Admitting: Internal Medicine

## 2015-01-20 ENCOUNTER — Ambulatory Visit (HOSPITAL_BASED_OUTPATIENT_CLINIC_OR_DEPARTMENT_OTHER): Payer: Commercial Managed Care - PPO

## 2015-06-06 IMAGING — MG MM DIGITAL SCREENING
5 series · 5 of 5 positions shown · non-contrast
Comparison: Previous Exam(s)

CLINICAL DATA: Screening.

EXAM:
DIGITAL SCREENING BILATERAL MAMMOGRAM WITH CAD

[R CC]
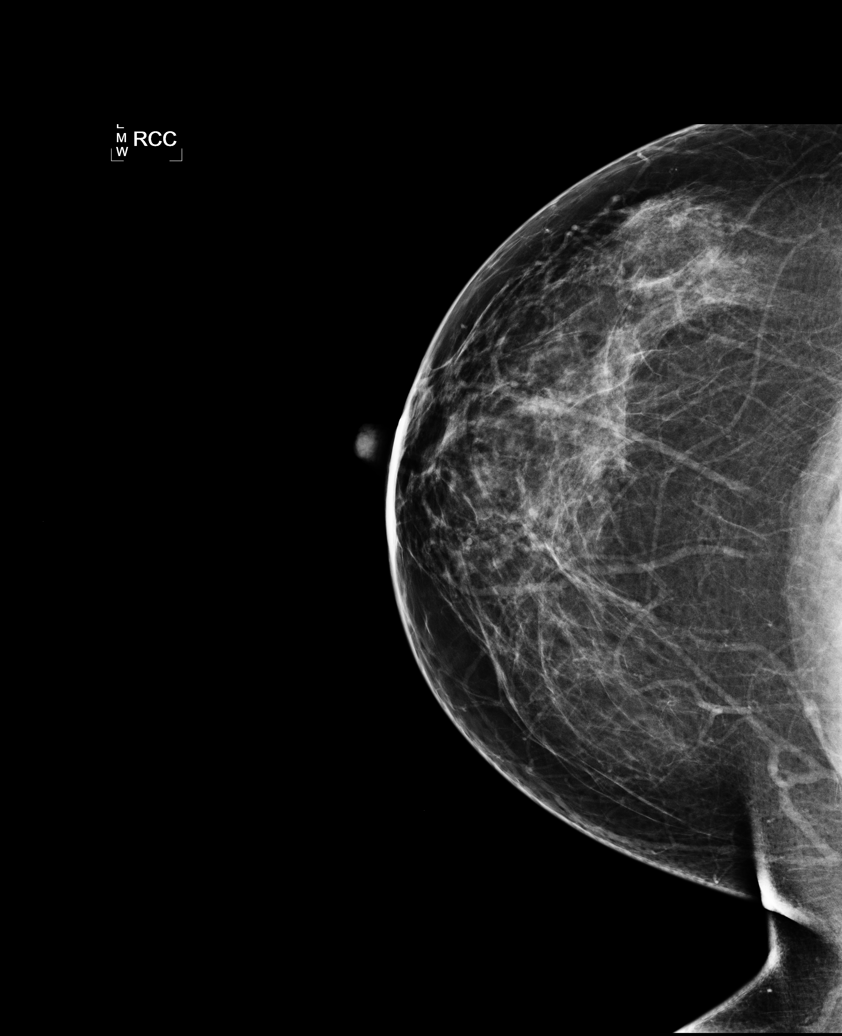

[L CC]
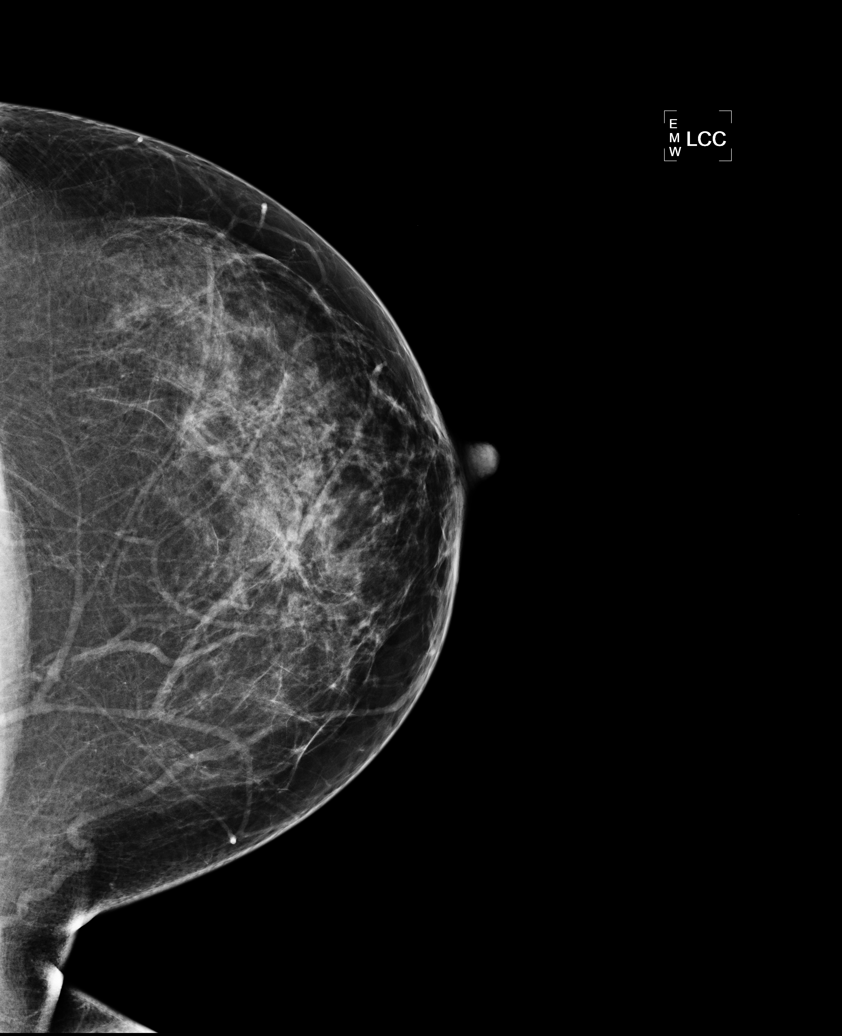

[L MLO]
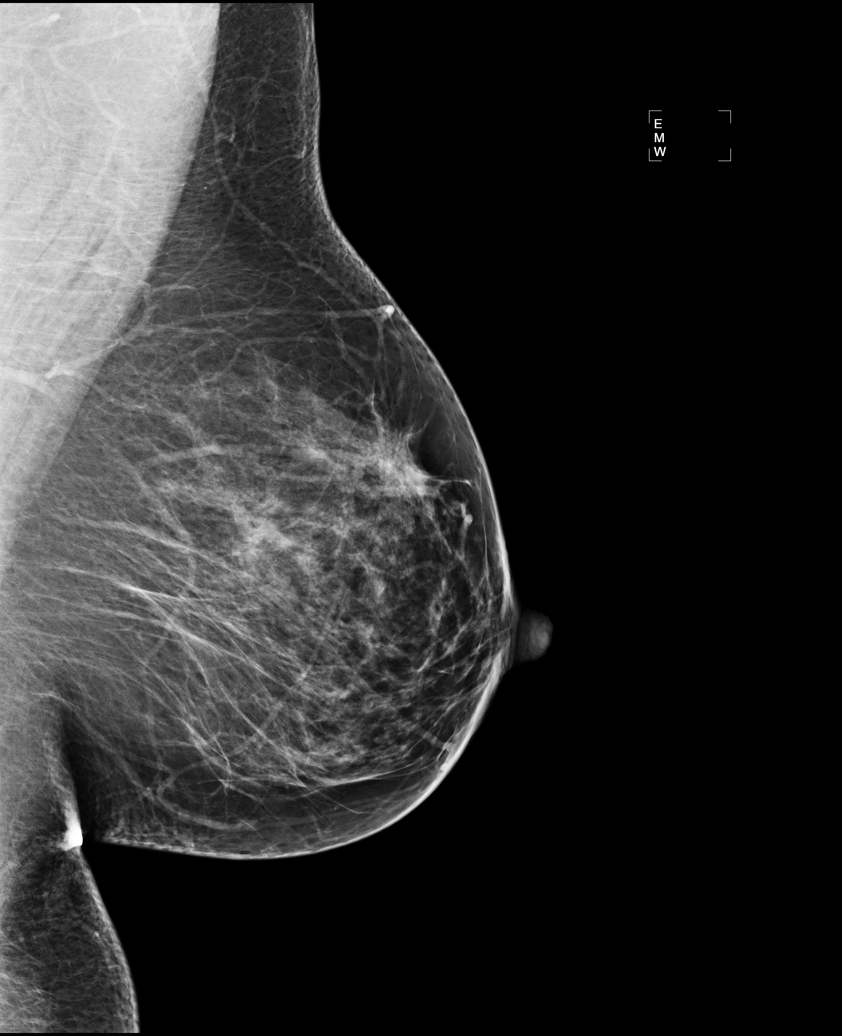

[R MLO (1 of 2)]
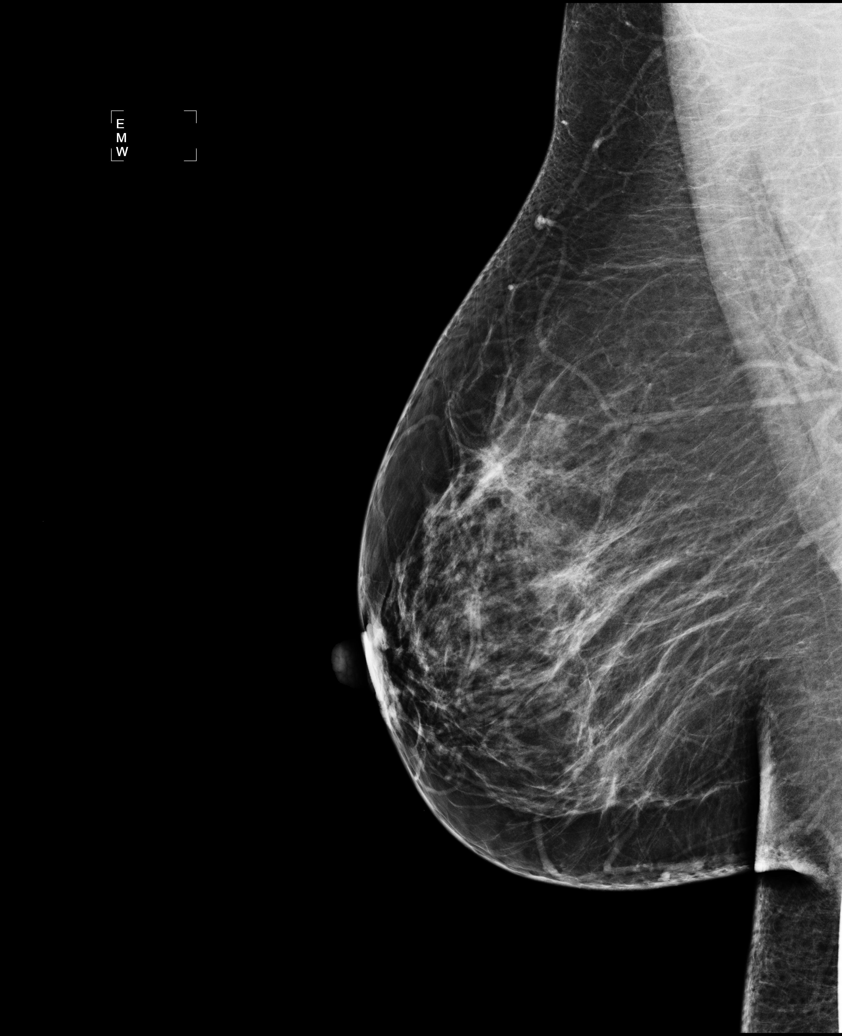

[R MLO (2 of 2)]
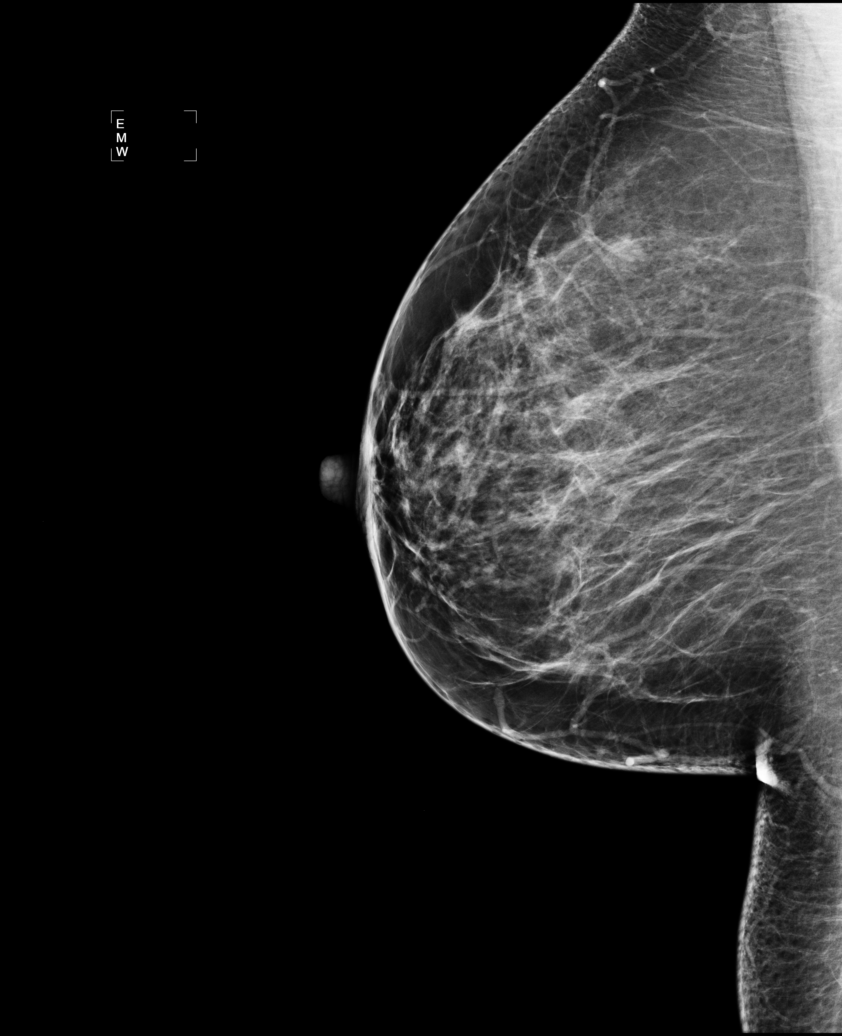

[5 of 5 positions shown; findings below may reference images not displayed]

ACR Breast Density Category b: There are scattered areas of
fibroglandular density.
FINDINGS: In the left breast, possible distortion warrants further evaluation
with spot compression views and possibly ultrasound. In the right
breast, no suspicious masses or malignant type calcifications are
identified. Images were processed with CAD.
IMPRESSION: Further evaluation is suggested for possible distortion in the left
breast.

RECOMMENDATION:
Diagnostic mammogram and possibly ultrasound of the left breast.
(Code:[id number])

The patient will be contacted regarding the findings, and additional
imaging will be scheduled.

BI-RADS CATEGORY  0: Incomplete. Need additional imaging evaluation
and/or prior mammograms for comparison.

## 2016-02-29 ENCOUNTER — Telehealth: Payer: Self-pay

## 2016-02-29 NOTE — Telephone Encounter (Signed)
Pre visit call made to patient. Left message for return call. 

## 2016-03-01 ENCOUNTER — Encounter: Payer: Self-pay | Admitting: Family

## 2016-03-01 ENCOUNTER — Ambulatory Visit (INDEPENDENT_AMBULATORY_CARE_PROVIDER_SITE_OTHER): Payer: 59 | Admitting: Family

## 2016-03-01 VITALS — BP 158/92 | HR 77 | Temp 98.0°F | Resp 18 | Ht 64.0 in | Wt 186.8 lb

## 2016-03-01 DIAGNOSIS — G4700 Insomnia, unspecified: Secondary | ICD-10-CM | POA: Diagnosis not present

## 2016-03-01 DIAGNOSIS — R030 Elevated blood-pressure reading, without diagnosis of hypertension: Secondary | ICD-10-CM | POA: Diagnosis not present

## 2016-03-01 DIAGNOSIS — Z0000 Encounter for general adult medical examination without abnormal findings: Secondary | ICD-10-CM

## 2016-03-01 NOTE — Assessment & Plan Note (Signed)
Stable. Very rare use of fioricet.  Generally controlled with tylenol or motrin.

## 2016-03-01 NOTE — Assessment & Plan Note (Signed)
Stable with rare prn sonata.

## 2016-03-01 NOTE — Progress Notes (Signed)
Pre visit review using our clinic review tool, if applicable. No additional management support is needed unless otherwise documented below in the visit note. 

## 2016-03-01 NOTE — Patient Instructions (Signed)
Please schedule a nurse visit tomorrow AM for a blood pressure recheck.  Schedule a complete physical at the front desk.

## 2016-03-01 NOTE — Progress Notes (Signed)
Subjective:    Patient ID: Valerie Merritt, female    DOB: [birth year], 46 y.o.   MRN: [id number]  HPI  Valerie Merritt is a 46 yr old female who presents today to establish care.    Pmhx is significant for the following  Headaches-reports chronic sinus problems.  Very rarely uses fiorcet  Tobacco abuse- 1/2 PPD to 3/4 PPD.  Quit once for 9 years.  She has tried wellbutrin in the past.  Worked well the first time and the second time it "made me angry."  Has tried chantix and it did not work.    Insomnia-  Reports that she rarely uses sonata.  She uses benadryl prn.  She works 2 jobs and works nights.     Review of Systems  Constitutional:       Has lost 11 pounds with diet  HENT: Negative for rhinorrhea.   Respiratory: Negative for cough.   Cardiovascular: Negative for leg swelling.  Gastrointestinal: Negative for constipation and diarrhea.  Genitourinary: Negative for dysuria and frequency.  Musculoskeletal:       Some right hip pain  Neurological:       1-2 headaches a week, generally relieved by tylenol or ibuprofen.   Hematological: Negative for adenopathy.  Psychiatric/Behavioral:       Denies anxiety/depression   Past Medical History:  Diagnosis Date  . Anxiety   . Frequent headaches   . History of appendectomy 1985  . Insomnia   . Obesity   . Pneumonia   . TMJ (dislocation of temporomandibular joint)   . 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       Social History   Social History  . Marital status: Married    Spouse name: Jethro BastosBunn   . Number of children: 2  . Years of education: 14   Occupational History  . RN [employer]   Social History Main Topics  . Smoking status: Current Every Day Smoker    Packs/day: 1.00    Years: 25.00    Types: Cigarettes  . Smokeless tobacco: Never Used     Comment: She is down to 1/2 ppd;   Marland Kitchen. Alcohol use No  . Drug use: No  . Sexual activity: Yes   Partners: Male    Birth control/ protection: IUD   Other Topics Concern  . Not on file   Social History Narrative   Marital Status: Married Adela Glimpse(Bernard) [place]unn   Children:  Daughter [name] Son [name]     Pets: Dogs (3) Cat (1) Bird (1) Chickens (>20)  Ducks (>4)    Living Situation: Lives with husband and children.   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  She wants to quit and is currently the nicotine patch.     Alcohol Use:  None    Drug Use:  None   Diet:  Regular   Exercise:  Walking three times per week.    Hobbies: Reading, Sewing      Past Surgical History:  Procedure Laterality Date  . APPENDECTOMY  1985  . CHOLECYSTECTOMY N/A 02/14/2014   Procedure: LAPAROSCOPIC CHOLECYSTECTOMY;  Surgeon: Harriette Bouillonhomas Cornett, MD;  Location: [facility] OR;  Service: General;  Laterality: N/A;  . INTRAUTERINE DEVICE (IUD) INSERTION  12/2011  . URETHRAL DILATION  Family History  Problem Relation Age of Onset  . Diabetes Mother   . Hypertension Mother   . Heart disease Father   . Cancer - Colon Maternal Grandfather   . Cancer Maternal Grandfather 50    Colon   . Multiple sclerosis Sister   . Hepatitis C Brother   . Other Brother     Avascular Necrosis  . Drug abuse Brother     Crack, Pain Meds   . Cancer Daughter     Hodgkin's Lymphoma   . Stroke Maternal Grandmother   . Hypertension Maternal Grandmother   . Cancer Sister     Brain     Allergies  Allergen Reactions  . Dulera [Mometasone Furo-Formoterol Fum] Shortness Of Breath and Palpitations    SOB and tachycardia  . Ivp Dye [Iodinated Diagnostic Agents] Anaphylaxis  . Penicillins Rash    Current Outpatient Prescriptions on File Prior to Visit  Medication Sig Dispense Refill  . acetaminophen (TYLENOL) 325 MG tablet Take 2 tablets (650 mg total) by mouth every 6 (six) hours as needed for mild pain, moderate pain,  fever or headache.    . zaleplon (SONATA) 10 MG capsule Take 1 capsule (10 mg total) by mouth at bedtime. 90 capsule 0   No current facility-administered medications on file prior to visit.     BP (!) 158/92 (BP Location: Right Arm, Cuff Size: Large)   Pulse 77   Temp 98 F (36.7 C) (Oral)   Resp 18   Ht 5\' 4"  (1.626 m)   Wt 186 lb 12.8 oz (84.7 kg)   SpO2 99% Comment: room air  BMI 32.06 kg/m       Objective:   Physical Exam  Constitutional: She is oriented to person, place, and time. She appears well-developed and well-nourished.  HENT:  Head: Normocephalic and atraumatic.  Eyes: No scleral icterus.  Cardiovascular: Normal rate, regular rhythm and normal heart sounds.   No murmur heard. Pulmonary/Chest: Effort normal and breath sounds normal. No respiratory distress. She has no wheezes.  Musculoskeletal: She exhibits no edema.  Lymphadenopathy:    She has no cervical adenopathy.  Neurological: She is alert and oriented to person, place, and time.  Skin: Skin is warm and dry.  Psychiatric: She has a normal mood and affect. Her behavior is normal. Judgment and thought content normal.          Assessment & Plan:  Elevated blood pressure- advised patient to return tomorrow AM for BP recheck. If still elevated plan to begin BP medication.

## 2016-03-02 ENCOUNTER — Encounter: Payer: 59 | Admitting: Family

## 2016-03-09 ENCOUNTER — Ambulatory Visit (INDEPENDENT_AMBULATORY_CARE_PROVIDER_SITE_OTHER): Payer: 59 | Admitting: Family

## 2016-03-09 VITALS — BP 134/91 | HR 91

## 2016-03-09 DIAGNOSIS — R030 Elevated blood-pressure reading, without diagnosis of hypertension: Secondary | ICD-10-CM

## 2016-03-09 NOTE — Progress Notes (Signed)
Noted and agree. 

## 2016-03-09 NOTE — Progress Notes (Signed)
Pre visit review using our clinic tool,if applicable. No additional management support is needed unless otherwise documented below in the visit note.   Patient in for BP check per order from M. Peggyann Juba'Sullivan, NP. BP = 143/91 P 84, 134/91 P 86  No changes per Verdene LennertM. Osullivan, follow up in 3 months with provider. Appointment scheduled.

## 2016-03-10 ENCOUNTER — Ambulatory Visit (HOSPITAL_BASED_OUTPATIENT_CLINIC_OR_DEPARTMENT_OTHER)
Admission: RE | Admit: 2016-03-10 | Discharge: 2016-03-10 | Disposition: A | Discharge status: Home / Self Care | Payer: 59 | Source: Ambulatory Visit | Attending: Family | Admitting: Family

## 2016-03-10 DIAGNOSIS — Z1231 Encounter for screening mammogram for malignant neoplasm of breast: Secondary | ICD-10-CM | POA: Insufficient documentation

## 2016-03-10 DIAGNOSIS — Z0000 Encounter for general adult medical examination without abnormal findings: Secondary | ICD-10-CM

## 2016-03-10 MED FILL — CLINDAMYCIN HCL 300 MG CAPS: 300 | 10 days supply | Qty: 40 | Fill #0

## 2019-05-28 ENCOUNTER — Other Ambulatory Visit (HOSPITAL_BASED_OUTPATIENT_CLINIC_OR_DEPARTMENT_OTHER): Payer: Self-pay | Admitting: Family

## 2019-05-28 DIAGNOSIS — Z1231 Encounter for screening mammogram for malignant neoplasm of breast: Secondary | ICD-10-CM

## 2019-06-06 ENCOUNTER — Ambulatory Visit (HOSPITAL_BASED_OUTPATIENT_CLINIC_OR_DEPARTMENT_OTHER): Payer: Self-pay

## 2019-06-12 ENCOUNTER — Ambulatory Visit (HOSPITAL_BASED_OUTPATIENT_CLINIC_OR_DEPARTMENT_OTHER)
Admission: RE | Admit: 2019-06-12 | Discharge: 2019-06-12 | Disposition: A | Discharge status: Home / Self Care | Payer: BC Managed Care – PPO | Source: Ambulatory Visit | Attending: Family | Admitting: Family

## 2019-06-12 ENCOUNTER — Other Ambulatory Visit: Payer: Self-pay

## 2019-06-12 DIAGNOSIS — Z1231 Encounter for screening mammogram for malignant neoplasm of breast: Secondary | ICD-10-CM | POA: Insufficient documentation

## 2019-07-03 ENCOUNTER — Encounter: Payer: Self-pay | Admitting: *Deleted

## 2019-07-03 ENCOUNTER — Telehealth: Payer: Self-pay | Admitting: *Deleted

## 2019-07-03 NOTE — Telephone Encounter (Signed)
Left patient a message to call and schedule appointment.

## 2019-07-05 ENCOUNTER — Telehealth: Payer: Self-pay | Admitting: *Deleted

## 2019-07-05 NOTE — Telephone Encounter (Signed)
-----   Message from Lesly Dukes, MD sent at 07/05/2019 10:38 AM EDT ----- Cool, thanks!! Have a great weekend! I still need to get you the dish drainer!  [facility] ----- Message ----- From: Granville Lewis, RN Sent: 07/05/2019  10:27 AM EDT To: Lesly Dukes, MD  Niza is taking Vit D 50K and B12 injections through her PCP.  I talked to her

## 2019-07-05 NOTE — Telephone Encounter (Signed)
Spoke with patient regarding labs we had received from her PCP.  Her Vit D level was low and she was given Vit D 50K.  Her B12 was low also and she is now taking B12 injections through her PCP.  Dr Penne Lash aware.

## 2019-08-12 ENCOUNTER — Ambulatory Visit (INDEPENDENT_AMBULATORY_CARE_PROVIDER_SITE_OTHER): Payer: BC Managed Care – PPO | Admitting: Obstetrics & Gynecology

## 2019-08-12 ENCOUNTER — Other Ambulatory Visit: Payer: Self-pay

## 2019-08-12 ENCOUNTER — Other Ambulatory Visit (HOSPITAL_COMMUNITY)
Admission: RE | Admit: 2019-08-12 | Discharge: 2019-08-12 | Disposition: A | Discharge status: Home / Self Care | Payer: BC Managed Care – PPO | Source: Ambulatory Visit | Attending: Obstetrics & Gynecology | Admitting: Obstetrics & Gynecology

## 2019-08-12 ENCOUNTER — Encounter: Payer: Self-pay | Admitting: Obstetrics & Gynecology

## 2019-08-12 VITALS — BP 140/77 | HR 95 | Ht 64.0 in | Wt 195.0 lb

## 2019-08-12 DIAGNOSIS — N951 Menopausal and female climacteric states: Secondary | ICD-10-CM

## 2019-08-12 DIAGNOSIS — Z01419 Encounter for gynecological examination (general) (routine) without abnormal findings: Secondary | ICD-10-CM

## 2019-08-12 DIAGNOSIS — Z3202 Encounter for pregnancy test, result negative: Secondary | ICD-10-CM

## 2019-08-12 DIAGNOSIS — Z30433 Encounter for removal and reinsertion of intrauterine contraceptive device: Secondary | ICD-10-CM

## 2019-08-12 DIAGNOSIS — Z975 Presence of (intrauterine) contraceptive device: Secondary | ICD-10-CM

## 2019-08-12 LAB — POCT URINE PREGNANCY: Preg Test, Ur: NEGATIVE

## 2019-08-12 MED ORDER — LEVONORGESTREL 20 MCG/24HR IU IUD: INTRAUTERINE_SYSTEM | Freq: Once | INTRAUTERINE | Status: AC

## 2019-08-12 NOTE — Progress Notes (Signed)
Subjective:     Valerie Merritt is a 49 y.o. female here for a routine exam.  Current complaints: low libido, hot flashes last 6 months.     Gynecologic History No LMP recorded. (Menstrual status: IUD). Contraception: IUD Last Pap: 2013. Results were: normal Last mammogram: 2021. Results were: normal  Obstetric History OB History  Gravida Para Term Preterm AB Living  2 2 2     2   SAB TAB Ectopic Multiple Live Births               # Outcome Date GA Lbr Len/2nd Weight Sex Delivery Anes PTL Lv  2 Term           1 Term              The following portions of the patient's history were reviewed and updated as appropriate: allergies, current medications, past family history, past medical history, past social history, past surgical history and problem list.  Review of Systems Pertinent items noted in HPI and remainder of comprehensive ROS otherwise negative.    Objective:      Vitals:   08/12/19 1156  BP: 140/77  Pulse: 95  Weight: 195 lb (88.5 kg)  Height: 5\' 4"  (1.626 m)   Vitals:  WNL General appearance: alert, cooperative and no distress  HEENT: Normocephalic, without obvious abnormality, atraumatic Eyes: negative Throat: lips, mucosa, and tongue normal; teeth and gums normal  Respiratory: Clear to auscultation bilaterally  CV: Regular rate and rhythm  Breasts:  Normal appearance, no masses or tenderness, no nipple retraction or dimpling  GI: Soft, non-tender; bowel sounds normal; no masses,  no organomegaly  GU: External Genitalia:  Tanner V, no lesion Urethra:  No prolapse   Vagina: Pink, normal rugae, no blood or discharge  Cervix: No CMT, no lesion  Uterus:  Normal size and contour, non tender  Adnexa: Normal, no masses, non tender  Musculoskeletal: No edema, redness or tenderness in the calves or thighs  Skin: No lesions or rash  Lymphatic: Axillary adenopathy: none     Psychiatric: Normal mood and behavior        Assessment:    Healthy female  exam.    Plan:   Pap with cotesting IUD removal and insertion Check FSH Discussed low libido--pt not making time for herself or husband.  She commits to finding several hours a week to have a date.  Plans to have one overnight with no kids nor grand kids.    GYNECOLOGY CLINIC PROCEDURE NOTE  Valerie Merritt is a 49 y.o. [phone] here for Mirena IUD removal and reinsertion. No GYN concerns.  Last pap smear was on 2013 and was normal.  IUD Removal and Reinsertion  Patient identified, informed consent performed. Discussed risks of irregular bleeding, cramping, infection, malpositioning or misplacement of the IUD outside the uterus which may require further procedures. Time out was performed. Speculum placed in the vagina. Cervix visualized. Cleaned with Betadine x 2. Grasped anteriorly with a single tooth tenaculum. The strings of the IUD were grasped and pulled using ring forceps. The IUD was successfully removed in its entirety. Uterus sounded to 8 cm. Mirena IUD placed per manufacturer's recommendations. Strings trimmed to 2-3 cm. Tenaculum was removed, good hemostasis noted. Patient tolerated procedure well. Patient was given post-procedure instructions.  Patient was also asked to check IUD strings periodically and follow up in 6 weeks for IUD check.

## 2019-08-13 LAB — CYTOLOGY - PAP
Comment: NEGATIVE
Diagnosis: NEGATIVE
High risk HPV: NEGATIVE

## 2019-08-13 LAB — FOLLICLE STIMULATING HORMONE: FSH: 4.6 m[IU]/mL

## 2022-08-22 ENCOUNTER — Telehealth: Payer: Self-pay | Admitting: *Deleted

## 2022-08-22 NOTE — Telephone Encounter (Signed)
Pt called stating she had labs drawn at her PCP  FSH-59.86, LH 28.68,E2 <24.  She is experiencing hot flashes and night sweats.  She is wondering whether she is in menopause and could get her Mirena out.  Spoke with Dr Penne Lash who agrees she is in monopause but if she wanted to make sure she wasn't fertile she would advise leaving the IUD in for now.  Pt to make an appt.

## 2022-08-29 ENCOUNTER — Encounter: Payer: Self-pay | Admitting: Obstetrics & Gynecology

## 2022-08-29 ENCOUNTER — Ambulatory Visit: Payer: 59 | Admitting: Obstetrics & Gynecology

## 2022-08-29 VITALS — BP 138/82 | HR 83 | Ht 64.0 in | Wt 187.0 lb

## 2022-08-29 DIAGNOSIS — N951 Menopausal and female climacteric states: Secondary | ICD-10-CM | POA: Diagnosis not present

## 2022-08-29 MED ORDER — ESTRADIOL 0.025 MG/24HR TD PTTW: 1.0000 | MEDICATED_PATCH | TRANSDERMAL | 12 refills | Status: DC

## 2022-08-29 NOTE — Progress Notes (Signed)
Pt will schedule annual appt before she leaves today

## 2022-08-29 NOTE — Progress Notes (Signed)
   Subjective:    Patient ID: Valerie Merritt, female    DOB: 1970-03-16, 52 y.o.   MRN: [id number]  HPI  52 yo female with night sweats, some hot flashes and irritability   Pt has Mirena in for 3 years--no menses for past 10 years with Mirenas.  FSH 59.86 Estradiol: <24  Review of Systems  Constitutional: Negative.        Hot flashes and night sweats  Respiratory: Negative.    Cardiovascular: Negative.   Gastrointestinal: Negative.   Genitourinary: Negative.   Psychiatric/Behavioral:         Irritable--started on Lexapro       Objective:   Physical Exam Vitals reviewed.  Constitutional:      General: She is not in acute distress.    Appearance: She is well-developed.  HENT:     Head: Normocephalic and atraumatic.  Eyes:     Conjunctiva/sclera: Conjunctivae normal.  Cardiovascular:     Rate and Rhythm: Normal rate.  Pulmonary:     Effort: Pulmonary effort is normal.  Skin:    General: Skin is warm and dry.  Neurological:     Mental Status: She is alert and oriented to person, place, and time.  Psychiatric:        Mood and Affect: Mood normal.   Vitals:   08/29/22 0943  BP: 138/82  Pulse: 83  Weight: 187 lb (84.8 kg)  Height: 5\' 4"  (1.626 m)           Assessment & Plan:   52 yo female with menopausal symptoms. Labs c/w menopause  Continue mirena Vivelle dot--lowest dose and titrate up Needs annual exam/pap and will schedule.

## 2022-09-02 ENCOUNTER — Other Ambulatory Visit: Payer: Self-pay | Admitting: Family

## 2022-09-02 DIAGNOSIS — Z1231 Encounter for screening mammogram for malignant neoplasm of breast: Secondary | ICD-10-CM

## 2022-10-07 ENCOUNTER — Ambulatory Visit: Admission: RE | Admit: 2022-10-07 | Payer: 59 | Source: Ambulatory Visit

## 2022-10-07 DIAGNOSIS — Z1231 Encounter for screening mammogram for malignant neoplasm of breast: Secondary | ICD-10-CM

## 2022-10-10 ENCOUNTER — Ambulatory Visit: Payer: 59 | Admitting: Obstetrics & Gynecology

## 2022-11-07 ENCOUNTER — Ambulatory Visit: Payer: 59 | Admitting: Obstetrics & Gynecology

## 2023-05-21 ENCOUNTER — Encounter (HOSPITAL_BASED_OUTPATIENT_CLINIC_OR_DEPARTMENT_OTHER): Payer: Self-pay | Admitting: Emergency Medicine

## 2023-05-21 ENCOUNTER — Encounter (HOSPITAL_COMMUNITY)
Admission: EM | Disposition: A | Discharge status: Home / Self Care | Payer: Self-pay | Source: Home / Self Care | Attending: Internal Medicine

## 2023-05-21 ENCOUNTER — Inpatient Hospital Stay (HOSPITAL_BASED_OUTPATIENT_CLINIC_OR_DEPARTMENT_OTHER)
Admission: EM | Admit: 2023-05-21 | Discharge: 2023-05-24 | DRG: 854 | Disposition: A | Discharge status: Home / Self Care | Payer: PRIVATE HEALTH INSURANCE | Attending: Internal Medicine | Admitting: Internal Medicine

## 2023-05-21 ENCOUNTER — Other Ambulatory Visit: Payer: Self-pay

## 2023-05-21 ENCOUNTER — Emergency Department (HOSPITAL_BASED_OUTPATIENT_CLINIC_OR_DEPARTMENT_OTHER): Payer: PRIVATE HEALTH INSURANCE

## 2023-05-21 DIAGNOSIS — N3289 Other specified disorders of bladder: Secondary | ICD-10-CM | POA: Diagnosis not present

## 2023-05-21 DIAGNOSIS — Z975 Presence of (intrauterine) contraceptive device: Secondary | ICD-10-CM

## 2023-05-21 DIAGNOSIS — Z91041 Radiographic dye allergy status: Secondary | ICD-10-CM | POA: Diagnosis not present

## 2023-05-21 DIAGNOSIS — Z880 Allergy status to penicillin: Secondary | ICD-10-CM

## 2023-05-21 DIAGNOSIS — R6520 Severe sepsis without septic shock: Secondary | ICD-10-CM | POA: Diagnosis not present

## 2023-05-21 DIAGNOSIS — N179 Acute kidney failure, unspecified: Secondary | ICD-10-CM | POA: Diagnosis not present

## 2023-05-21 DIAGNOSIS — K760 Fatty (change of) liver, not elsewhere classified: Secondary | ICD-10-CM | POA: Diagnosis present

## 2023-05-21 DIAGNOSIS — Z807 Family history of other malignant neoplasms of lymphoid, hematopoietic and related tissues: Secondary | ICD-10-CM

## 2023-05-21 DIAGNOSIS — N12 Tubulo-interstitial nephritis, not specified as acute or chronic: Principal | ICD-10-CM

## 2023-05-21 DIAGNOSIS — Z79899 Other long term (current) drug therapy: Secondary | ICD-10-CM

## 2023-05-21 DIAGNOSIS — A419 Sepsis, unspecified organism: Principal | ICD-10-CM | POA: Diagnosis present

## 2023-05-21 DIAGNOSIS — Z6832 Body mass index (BMI) 32.0-32.9, adult: Secondary | ICD-10-CM

## 2023-05-21 DIAGNOSIS — Z8249 Family history of ischemic heart disease and other diseases of the circulatory system: Secondary | ICD-10-CM

## 2023-05-21 DIAGNOSIS — Z888 Allergy status to other drugs, medicaments and biological substances status: Secondary | ICD-10-CM | POA: Diagnosis not present

## 2023-05-21 DIAGNOSIS — N136 Pyonephrosis: Secondary | ICD-10-CM | POA: Diagnosis present

## 2023-05-21 DIAGNOSIS — R309 Painful micturition, unspecified: Secondary | ICD-10-CM | POA: Diagnosis not present

## 2023-05-21 DIAGNOSIS — F17210 Nicotine dependence, cigarettes, uncomplicated: Secondary | ICD-10-CM | POA: Diagnosis present

## 2023-05-21 DIAGNOSIS — N200 Calculus of kidney: Secondary | ICD-10-CM

## 2023-05-21 DIAGNOSIS — Z813 Family history of other psychoactive substance abuse and dependence: Secondary | ICD-10-CM

## 2023-05-21 DIAGNOSIS — N390 Urinary tract infection, site not specified: Secondary | ICD-10-CM

## 2023-05-21 DIAGNOSIS — Z9049 Acquired absence of other specified parts of digestive tract: Secondary | ICD-10-CM

## 2023-05-21 DIAGNOSIS — F32A Depression, unspecified: Secondary | ICD-10-CM | POA: Diagnosis present

## 2023-05-21 DIAGNOSIS — Z820 Family history of epilepsy and other diseases of the nervous system: Secondary | ICD-10-CM

## 2023-05-21 DIAGNOSIS — R110 Nausea: Secondary | ICD-10-CM | POA: Diagnosis not present

## 2023-05-21 DIAGNOSIS — F419 Anxiety disorder, unspecified: Secondary | ICD-10-CM | POA: Diagnosis present

## 2023-05-21 DIAGNOSIS — Z823 Family history of stroke: Secondary | ICD-10-CM | POA: Diagnosis not present

## 2023-05-21 DIAGNOSIS — R32 Unspecified urinary incontinence: Secondary | ICD-10-CM | POA: Diagnosis not present

## 2023-05-21 DIAGNOSIS — Z833 Family history of diabetes mellitus: Secondary | ICD-10-CM

## 2023-05-21 DIAGNOSIS — E669 Obesity, unspecified: Secondary | ICD-10-CM | POA: Diagnosis present

## 2023-05-21 DIAGNOSIS — Z1152 Encounter for screening for COVID-19: Secondary | ICD-10-CM | POA: Diagnosis not present

## 2023-05-21 DIAGNOSIS — F17200 Nicotine dependence, unspecified, uncomplicated: Principal | ICD-10-CM

## 2023-05-21 HISTORY — PX: CYSTOSCOPY WITH STENT PLACEMENT: SHX5790

## 2023-05-21 LAB — URINALYSIS, W/ REFLEX TO CULTURE (INFECTION SUSPECTED)
Bilirubin Urine: NEGATIVE
Glucose, UA: NEGATIVE mg/dL
Ketones, ur: NEGATIVE mg/dL
Nitrite: NEGATIVE
Protein, ur: NEGATIVE mg/dL
Specific Gravity, Urine: 1.02 (ref 1.005–1.030)
pH: 7.5 (ref 5.0–8.0)

## 2023-05-21 LAB — COMPREHENSIVE METABOLIC PANEL WITH GFR
ALT: 20 U/L (ref 0–44)
AST: 15 U/L (ref 15–41)
Albumin: 3.6 g/dL (ref 3.5–5.0)
Alkaline Phosphatase: 81 U/L (ref 38–126)
Anion gap: 9 (ref 5–15)
BUN: 7 mg/dL (ref 6–20)
CO2: 22 mmol/L (ref 22–32)
Calcium: 8.7 mg/dL — ABNORMAL LOW (ref 8.9–10.3)
Chloride: 106 mmol/L (ref 98–111)
Creatinine, Ser: 0.89 mg/dL (ref 0.44–1.00)
GFR, Estimated: >60 mL/min (ref >60)
Glucose, Bld: 109 mg/dL — ABNORMAL HIGH (ref 70–99)
Potassium: 3.5 mmol/L (ref 3.5–5.1)
Sodium: 137 mmol/L (ref 135–145)
Total Bilirubin: 0.9 mg/dL (ref 0.0–1.2)
Total Protein: 7.3 g/dL (ref 6.5–8.1)

## 2023-05-21 LAB — CBC WITH DIFFERENTIAL/PLATELET
Abs Immature Granulocytes: 0.07 10*3/uL (ref 0.00–0.07)
Basophils Absolute: 0 10*3/uL (ref 0.0–0.1)
Basophils Relative: 0 %
Eosinophils Absolute: 0 10*3/uL (ref 0.0–0.5)
Eosinophils Relative: 0 %
HCT: 38.5 % (ref 36.0–46.0)
Hemoglobin: 13.5 g/dL (ref 12.0–15.0)
Immature Granulocytes: 1 %
Lymphocytes Relative: 14 %
Lymphs Abs: 2.1 10*3/uL (ref 0.7–4.0)
MCH: 33.8 pg (ref 26.0–34.0)
MCHC: 35.1 g/dL (ref 30.0–36.0)
MCV: 96.3 fL (ref 80.0–100.0)
Monocytes Absolute: 1.1 10*3/uL — ABNORMAL HIGH (ref 0.1–1.0)
Monocytes Relative: 7 %
Neutro Abs: 12.2 10*3/uL — ABNORMAL HIGH (ref 1.7–7.7)
Neutrophils Relative %: 78 %
Platelets: 255 10*3/uL (ref 150–400)
RBC: 4 MIL/uL (ref 3.87–5.11)
RDW: 12 % (ref 11.5–15.5)
WBC: 15.5 10*3/uL — ABNORMAL HIGH (ref 4.0–10.5)
nRBC: 0 % (ref 0.0–0.2)

## 2023-05-21 LAB — LACTIC ACID, PLASMA: Lactic Acid, Venous: 1 mmol/L (ref 0.5–1.9)

## 2023-05-21 LAB — RESP PANEL BY RT-PCR (RSV, FLU A&B, COVID)  RVPGX2
Influenza A by PCR: NEGATIVE
Influenza B by PCR: NEGATIVE
Resp Syncytial Virus by PCR: NEGATIVE
SARS Coronavirus 2 by RT PCR: NEGATIVE

## 2023-05-21 SURGERY — CYSTOSCOPY, WITH RETROGRADE PYELOGRAM AND URETERAL STENT INSERTION
Anesthesia: Choice | Laterality: Left

## 2023-05-21 SURGERY — CYSTOSCOPY, WITH STENT INSERTION
Anesthesia: General | Site: Ureter | Laterality: Left

## 2023-05-21 MED ORDER — FENTANYL CITRATE PF 50 MCG/ML IJ SOSY
50.0000 ug | PREFILLED_SYRINGE | Freq: Once | INTRAMUSCULAR | Status: AC
  Administered 2023-05-21: 50 ug via INTRAVENOUS
  Filled 2023-05-21: qty 1

## 2023-05-21 MED ORDER — MIDAZOLAM HCL 2 MG/2ML IJ SOLN
INTRAMUSCULAR | Status: AC
  Filled 2023-05-21: qty 2

## 2023-05-21 MED ORDER — ACETAMINOPHEN 10 MG/ML IV SOLN
INTRAVENOUS | Status: AC
  Administered 2023-05-22: 1000 mg via INTRAVENOUS
  Filled 2023-05-21: qty 100

## 2023-05-21 MED ORDER — FENTANYL CITRATE (PF) 250 MCG/5ML IJ SOLN
INTRAMUSCULAR | Status: AC
  Filled 2023-05-21: qty 5

## 2023-05-21 MED ORDER — SODIUM CHLORIDE 0.9 % IV SOLN
2.0000 g | Freq: Once | INTRAVENOUS | Status: AC
  Administered 2023-05-21: 2 g via INTRAVENOUS
  Filled 2023-05-21: qty 20

## 2023-05-21 MED ORDER — PROPOFOL 10 MG/ML IV BOLUS
INTRAVENOUS | Status: AC
  Filled 2023-05-21: qty 20

## 2023-05-21 MED ORDER — SODIUM CHLORIDE 0.9 % IV SOLN: INTRAVENOUS | Status: AC | PRN

## 2023-05-21 SURGICAL SUPPLY — 20 items
BAG URINE DRAIN 2000ML AR STRL (UROLOGICAL SUPPLIES) IMPLANT
BAG URO CATCHER STRL LF (MISCELLANEOUS) ×1 IMPLANT
CATH FOLEY 2WAY SLVR 5CC 16FR (CATHETERS) IMPLANT
CATH URET 5FR 28IN OPEN ENDED (CATHETERS) IMPLANT
CATH URETL OPEN 5X70 (CATHETERS) IMPLANT
GLOVE BIO SURGEON STRL SZ7.5 (GLOVE) ×1 IMPLANT
GOWN STRL REUS W/ TWL LRG LVL3 (GOWN DISPOSABLE) ×1 IMPLANT
GOWN STRL REUS W/ TWL XL LVL3 (GOWN DISPOSABLE) ×1 IMPLANT
GUIDEWIRE ANG ZIPWIRE 038X150 (WIRE) IMPLANT
GUIDEWIRE STR DUAL SENSOR (WIRE) ×1 IMPLANT
KIT TURNOVER KIT B (KITS) ×1 IMPLANT
MANIFOLD NEPTUNE II (INSTRUMENTS) ×3 IMPLANT
NS IRRIG 1000ML POUR BTL (IV SOLUTION) ×1 IMPLANT
PACK CYSTO (CUSTOM PROCEDURE TRAY) ×1 IMPLANT
STENT URET 6FRX24 CONTOUR (STENTS) IMPLANT
STENT URET 6FRX26 CONTOUR (STENTS) IMPLANT
SYPHON OMNI JUG (MISCELLANEOUS) ×1 IMPLANT
TOWEL GREEN STERILE FF (TOWEL DISPOSABLE) ×1 IMPLANT
TUBE CONNECTING 12X1/4 (SUCTIONS) IMPLANT
WATER STERILE IRR 3000ML UROMA (IV SOLUTION) ×1 IMPLANT

## 2023-05-21 NOTE — Anesthesia Preprocedure Evaluation (Addendum)
 Anesthesia Evaluation  Patient identified by MRN, date of birth, ID band Patient awake    Reviewed: Allergy & Precautions, NPO status , Patient's Chart, lab work & pertinent test results  History of Anesthesia Complications Negative for: history of anesthetic complications  Airway Mallampati: II  TM Distance: >3 FB Neck ROM: Full    Dental  (+) Dental Advisory Given   Pulmonary Current SmokerPatient did not abstain from smoking.   breath sounds clear to auscultation       Cardiovascular negative cardio ROS  Rhythm:Regular Rate:Normal     Neuro/Psych  Headaches  Anxiety        GI/Hepatic Neg liver ROS,,,N/v with stone   Endo/Other  BMI 32  Renal/GU stone     Musculoskeletal   Abdominal   Peds  Hematology negative hematology ROS (+)   Anesthesia Other Findings   Reproductive/Obstetrics Post menopausal, and still has IUD                             Anesthesia Physical Anesthesia Plan  ASA: 2 and emergent  Anesthesia Plan: General   Post-op Pain Management: Ofirmev IV (intra-op)*   Induction: Intravenous and Rapid sequence  PONV Risk Score and Plan: 2 and Ondansetron and Dexamethasone  Airway Management Planned: Oral ETT  Additional Equipment: None  Intra-op Plan:   Post-operative Plan: Extubation in OR  Informed Consent: I have reviewed the patients History and Physical, chart, labs and discussed the procedure including the risks, benefits and alternatives for the proposed anesthesia with the patient or authorized representative who has indicated his/her understanding and acceptance.     Dental advisory given  Plan Discussed with: CRNA and Surgeon  Anesthesia Plan Comments:         Anesthesia Quick Evaluation

## 2023-05-21 NOTE — Consult Note (Signed)
 Urology Consult Note   Requesting Attending Physician:  Benjiman Core, MD Service Providing Consult: Urology   Reason for Consult:  Nephrolithiasis  HPI: Valerie Merritt is seen in consultation for reasons noted above at the request of Benjiman Core, MD for evaluation of septic stone.  She notes that she has had symptoms since Friday including flank pain and nausea. She then developed fever and chills.  The patient denies any lower urinary tract symptoms, specifically dysuria, hematuria or urinary tract infection . The patient has a history of, fevers, chills, and nausea.  She has no previous history of kidney stones. She denies a history of TUMS, current use of calcium supplementation, history of diarrhea, history of GI surgery, or history of gout.    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      Past Surgical History:  Past Surgical History:  Procedure Laterality Date   APPENDECTOMY  1985   CHOLECYSTECTOMY N/A 02/14/2014   Procedure: LAPAROSCOPIC CHOLECYSTECTOMY;  Surgeon: Harriette Bouillon, MD;  Location: [facility] OR;  Service: General;  Laterality: N/A;   INTRAUTERINE DEVICE (IUD) INSERTION  12/2011   URETHRAL DILATION      Medication: Current Facility-Administered Medications  Medication Dose Route Frequency Provider Last Rate Last Admin   0.9 %  sodium chloride infusion   Intravenous PRN Benjiman Core, MD 10 mL/hr at 05/21/23 2050 New Bag at 05/21/23 2050   Current Outpatient Medications  Medication Sig Dispense Refill   escitalopram (LEXAPRO) 10 MG tablet Take 10 mg by mouth daily.     estradiol (VIVELLE-DOT) 0.025 MG/24HR Place 1 patch onto the skin 2 (two) times a week. 8 patch 12   folic  acid (FOLVITE) 1 MG tablet Take 1 mg by mouth daily.     levonorgestrel (MIRENA) 20 MCG/DAY IUD 1 each by Intrauterine route once.     NON FORMULARY Hot Momma Perimenopause and menopausal relief     Pumpkin Seed-Soy Germ (AZO BLADDER CONTROL/GO-LESS PO) Take by mouth.      Allergies: Allergies  Allergen Reactions   Dulera [Mometasone Furo-Formoterol Fum] Shortness Of Breath and Palpitations    SOB and tachycardia   Ivp Dye [Iodinated Contrast Media] Anaphylaxis   Penicillins Rash    Social History: Social History   Tobacco Use   Smoking status: Every Day    Current packs/day: 1.00    Average packs/day: 1 pack/day for 25.0 years (25.0 ttl pk-yrs)    Types: Cigarettes   Smokeless tobacco: Never   Tobacco comments:    She is down to 1/2 ppd;   Vaping Use   Vaping status: Never Used  Substance Use Topics   Alcohol use: No   Drug use: No    Family History Family History  Problem Relation Age of Onset   Diabetes Mother    Hypertension Mother    Heart disease Father    Multiple sclerosis Sister    Cancer Sister        Brain    Cancer Daughter        Hodgkin's Lymphoma    Stroke Maternal Grandmother    Hypertension Maternal Grandmother    Cancer -  Colon Maternal Grandfather    Cancer Maternal Grandfather 38       Colon    Hepatitis C Brother    Other Brother        Avascular Necrosis   Drug abuse Brother        Crack, Pain Meds    Breast cancer Neg Hx     Review of Systems 10 systems were reviewed and are negative except as noted specifically in the HPI.  Objective   Vital signs in last 24 hours: BP (!) 160/92 (BP Location: Right Arm)   Pulse (!) 118   Temp (!) 102.9 F (39.4 C) (Oral)   Resp (!) 22   Ht 5\' 4"  (1.626 m)   Wt 83.9 kg   SpO2 95%   BMI 31.76 kg/m   Physical Exam General: NAD, A&O, resting, appropriate HEENT: [place]/AT, EOMI, MMM Pulmonary: Normal work of breathing Cardiovascular: HDS, adequate peripheral perfusion Abdomen: Soft, non  distended Extremities: warm and well perfused Neuro: Appropriate, no focal neurological deficits  Most Recent Labs: Lab Results  Component Value Date   WBC 15.5 (H) 05/21/2023   HGB 13.5 05/21/2023   HCT 38.5 05/21/2023   PLT 255 05/21/2023    Lab Results  Component Value Date   NA 137 05/21/2023   K 3.5 05/21/2023   CL 106 05/21/2023   CO2 22 05/21/2023   BUN 7 05/21/2023   CREATININE 0.89 05/21/2023   CALCIUM 8.7 (L) 05/21/2023    No results found for: "INR", "APTT"   Urine Culture: @[id number] (laburin,org,[id number],[id number])@   IMAGING: CT Renal Stone Study Result Date: 05/21/2023 CLINICAL DATA:  Abdominal/flank pain.  Concern for kidney stone. EXAM: CT ABDOMEN AND PELVIS WITHOUT CONTRAST TECHNIQUE: Multidetector CT imaging of the abdomen and pelvis was performed following the standard protocol without IV contrast. RADIATION DOSE REDUCTION: This exam was performed according to the departmental dose-optimization program which includes automated exposure control, adjustment of the mA and/or kV according to patient size and/or use of iterative reconstruction technique. COMPARISON:  None Available. FINDINGS: Evaluation of this exam is limited in the absence of intravenous contrast. Lower chest: The visualized lung bases are clear. No intra-abdominal free air or free fluid. Hepatobiliary: Mild fatty liver. No biliary dilatation. Cholecystectomy. No retained calcified stone noted in the central CBD. Pancreas: Unremarkable. No pancreatic ductal dilatation or surrounding inflammatory changes. Spleen: Normal in size without focal abnormality. Adrenals/Urinary Tract: The adrenal glands are unremarkable. There is a 2 mm stone in the proximal left ureter with mild left hydronephrosis. There is no hydronephrosis or nephrolithiasis on the right. Subcentimeter partially exophytic right renal interpolar hypodense lesion is not characterized on this CT, possibly a cyst. Ultrasound may provide better  evaluation on a nonemergent/outpatient basis. The right ureter appears unremarkable. The urinary bladder is minimally distended and grossly unremarkable. Stomach/Bowel: There is no bowel obstruction or active inflammation. The appendix is not visualized with certainty. No inflammatory changes identified in the right lower quadrant. Vascular/Lymphatic: Mild aortoiliac atherosclerotic disease. The IVC is unremarkable. No portal venous gas. There is no adenopathy. Reproductive: The uterus is anteverted. An intrauterine device is noted. No suspicious adnexal masses. Other: None Musculoskeletal: No acute or significant osseous findings. IMPRESSION: 1. A 2 mm proximal left ureteral stone with mild left hydronephrosis. 2. No bowel obstruction. 3. Mild fatty liver. Electronically Signed   By: Elgie Collard M.D.   On: 05/21/2023 20:39    ------  Assessment:  53 y.o. female with history of obesity, TMJ, headache, insomnia, ?  Past history of urethral dilation who presented with left  flank pain, febrile to 102.9 and tachycardic to 118 and CT that demonstrates 2mm left obstructing stone with mild associated hydronephrosis.   On arrival, patient was febrile to 102.9 and tachycardia to 118.  Labs significant for creatinine of 0.89 which is her baseline.  White blood cell count of 15.5.  Lactic acid was negative.  Blood cultures and urine cultures pending.  UA with small leukocyte esterase and rare bacteria not very concerning for urinary tract infection however the patient was recently on antibiotics.  Concerning for infection given: Febrile to 102.9, tachycardic to 118 with a white blood cell count of 15.5 In the ED received: Fluids and antibiotics with ceftriaxone  We discussed the indications for acute intervention including infected obstruction, bilateral ureteral obstruction or unilateral obstruction of solitary kidney as well as other less urgent indications for decompression which would included  intractable pain, N/V, and acute renal injury. We also discussed the possible inability to place a ureteral stent in which case, the next intervention recommended would be a percutaneous nephrostomy tube. Given concern for infection is present, plan for urgent transfer to [facility] as well as surgery with left ureteral stent placement.     Recommendations: Plan for emergent  left ureteral stent placement Case posted;  Keep patient NPO Consent obtained from patient Follow-up urine cultures and blood cultures Continue Rocephin for antibiotics If continued to fever, please place Foley catheter for max decompression. Patient should be admitted to medicine service. Patient require definitive stone treatment in the future.   Thank you for this consult. Please contact the urology consult pager with any further questions/concerns.

## 2023-05-21 NOTE — ED Notes (Signed)
 EDP at bedside

## 2023-05-21 NOTE — ED Triage Notes (Addendum)
 Seen at [facility] for concern for possible kidney stone due to L flank pain. UC provider started her on macrobid. Pt developed fever, chills that started today. Pt took tylenol 30 min PTA. Temp on arrival 102.9.

## 2023-05-21 NOTE — ED Provider Notes (Signed)
[place] [facility] AT [facility] Provider Note   CSN: [id number] Arrival date & time: 05/21/23  1947     History  Chief Complaint  Patient presents with   Fever    BABETTA PATERSON is a 53 y.o. female.   Fever Patient presents with left flank pain.  Has had for the last few days.  Had been seen at urgent care and diagnosed with potential UTI.  Started on Macrobid.     Home Medications Prior to Admission medications   Medication Sig Start Date End Date Taking? Authorizing Provider  escitalopram (LEXAPRO) 10 MG tablet Take 10 mg by mouth daily.    [provider]  estradiol (VIVELLE-DOT) 0.025 MG/24HR Place 1 patch onto the skin 2 (two) times a week. 08/29/22   Lesly Dukes, MD  folic acid (FOLVITE) 1 MG tablet Take 1 mg by mouth daily.    [provider]  levonorgestrel (MIRENA) 20 MCG/DAY IUD 1 each by Intrauterine route once.    [provider]  NON FORMULARY Hot Momma Perimenopause and menopausal relief    [provider]  Pumpkin Seed-Soy Germ (AZO BLADDER CONTROL/GO-LESS PO) Take by mouth.    [provider]      Allergies    Elwin Sleight [mometasone furo-formoterol fum], Ivp dye [iodinated contrast media], and Penicillins    Review of Systems   Review of Systems  Constitutional:  Positive for fever.    Physical Exam Updated Vital Signs BP (!) 160/92 (BP Location: Right Arm)   Pulse (!) 118   Temp (!) 102.9 F (39.4 C) (Oral)   Resp (!) 22   Ht 5\' 4"  (1.626 m)   Wt 83.9 kg   SpO2 95%   BMI 31.76 kg/m  Physical Exam  ED Results / Procedures / Treatments   Labs (all labs ordered are listed, but only abnormal results are displayed) Labs Reviewed  URINALYSIS, W/ REFLEX TO CULTURE (INFECTION SUSPECTED) - Abnormal; Notable for the following components:      Result Value   Hgb urine dipstick SMALL (*)    Leukocytes,Ua SMALL (*)    Bacteria, UA RARE (*)    All other components within  normal limits  COMPREHENSIVE METABOLIC PANEL WITH GFR - Abnormal; Notable for the following components:   Glucose, Bld 109 (*)    Calcium 8.7 (*)    All other components within normal limits  CBC WITH DIFFERENTIAL/PLATELET - Abnormal; Notable for the following components:   WBC 15.5 (*)    Neutro Abs 12.2 (*)    Monocytes Absolute 1.1 (*)    All other components within normal limits  CULTURE, BLOOD (ROUTINE X 2)  CULTURE, BLOOD (ROUTINE X 2)  RESP PANEL BY RT-PCR (RSV, FLU A&B, COVID)  RVPGX2  LACTIC ACID, PLASMA  LACTIC ACID, PLASMA    EKG None  Radiology CT Renal Stone Study Result Date: 05/21/2023 CLINICAL DATA:  Abdominal/flank pain.  Concern for kidney stone. EXAM: CT ABDOMEN AND PELVIS WITHOUT CONTRAST TECHNIQUE: Multidetector CT imaging of the abdomen and pelvis was performed following the standard protocol without IV contrast. RADIATION DOSE REDUCTION: This exam was performed according to the departmental dose-optimization program which includes automated exposure control, adjustment of the mA and/or kV according to patient size and/or use of iterative reconstruction technique. COMPARISON:  None Available. FINDINGS: Evaluation of this exam is limited in the absence of intravenous contrast. Lower chest: The visualized lung bases are clear. No intra-abdominal free air or free fluid.  Hepatobiliary: Mild fatty liver. No biliary dilatation. Cholecystectomy. No retained calcified stone noted in the central CBD. Pancreas: Unremarkable. No pancreatic ductal dilatation or surrounding inflammatory changes. Spleen: Normal in size without focal abnormality. Adrenals/Urinary Tract: The adrenal glands are unremarkable. There is a 2 mm stone in the proximal left ureter with mild left hydronephrosis. There is no hydronephrosis or nephrolithiasis on the right. Subcentimeter partially exophytic right renal interpolar hypodense lesion is not characterized on this CT, possibly a cyst. Ultrasound may provide  better evaluation on a nonemergent/outpatient basis. The right ureter appears unremarkable. The urinary bladder is minimally distended and grossly unremarkable. Stomach/Bowel: There is no bowel obstruction or active inflammation. The appendix is not visualized with certainty. No inflammatory changes identified in the right lower quadrant. Vascular/Lymphatic: Mild aortoiliac atherosclerotic disease. The IVC is unremarkable. No portal venous gas. There is no adenopathy. Reproductive: The uterus is anteverted. An intrauterine device is noted. No suspicious adnexal masses. Other: None Musculoskeletal: No acute or significant osseous findings. IMPRESSION: 1. A 2 mm proximal left ureteral stone with mild left hydronephrosis. 2. No bowel obstruction. 3. Mild fatty liver. Electronically Signed   By: Elgie Collard M.D.   On: 05/21/2023 20:39    Procedures Procedures    Medications Ordered in ED Medications  0.9 %  sodium chloride infusion ( Intravenous New Bag/Given 05/21/23 2050)  cefTRIAXone (ROCEPHIN) 2 g in sodium chloride 0.9 % 100 mL IVPB (0 g Intravenous Stopped 05/21/23 2123)    ED Course/ Medical Decision Making/ A&P                                 Medical Decision Making Amount and/or Complexity of Data Reviewed Labs: ordered. Radiology: ordered.  Risk Prescription drug management.   Patient with fever.  Left flank pain.  Dysuria.  Recent reported positive dipstick urine.  Now having fevers.  Also has had cough and that has been there for a month and patient attributed to the pollen.  White count is elevated.  Normal lactic acid.  Back with fever and tachycardia urinary symptoms infected obstructed stone and pyelonephritis considered.  CT scan done and does show 2 mm obstructing stone.  Has already been given Rocephin.  Normal lactic acid.  Discussed with Dr. Gordan Payment, from urology.  Will need to transfer to [facility] ER to ER to get stent.  No OR availability at [place]  long.  Discussed with Dr. Antionette Char from hospitalist who accepted for admission.  Also discussed with Dr. Hart Rochester in the ER who accepted transfer ER to ER.  CRITICAL CARE Performed by: Benjiman Core Total critical care time: 30 minutes Critical care time was exclusive of separately billable procedures and treating other patients. Critical care was necessary to treat or prevent imminent or life-threatening deterioration. Critical care was time spent personally by me on the following activities: development of treatment plan with patient and/or surrogate as well as nursing, discussions with consultants, evaluation of patient's response to treatment, examination of patient, obtaining history from patient or surrogate, ordering and performing treatments and interventions, ordering and review of laboratory studies, ordering and review of radiographic studies, pulse oximetry and re-evaluation of patient's condition.         Final Clinical Impression(s) / ED Diagnoses Final diagnoses:  Pyelonephritis  Kidney stone    Rx / DC Orders ED Discharge Orders     None         Benjiman Core, MD  05/21/23 2142  

## 2023-05-21 NOTE — Progress Notes (Addendum)
 Plan of Care Note for accepted transfer   Patient: Valerie Merritt MRN: [id number]   DOA: 05/21/2023  Facility requesting transfer: [facility]   Requesting Provider: Dr. Rubin Payor   Reason for transfer: Sepsis d/t UTI with obstructing left ureteral stone   Facility course: 53 yr old female with anxiety presents with left flank pain, fever and chills.   She is febrile and tachycardic with stable BP. WBC is 15.5. Lactate is 1.0.   CT demonstrates 2 mm left ureteral calculus with mild hydronephrosis.   Urology was consulted by the ED physician and recommended medical admission to [facility].   Blood cultures were collected and patient was treated with 2 g IV Rocephin.   Plan of care: The patient is accepted for admission to Progressive unit, at [facility].   Author: Briscoe Deutscher, MD 05/21/2023  Check [email or web address] for on-call coverage.  Nursing staff, Please call [facility] Admits & Consults System-Wide number on Amion as soon as patient's arrival, so appropriate admitting provider can evaluate the pt.

## 2023-05-21 NOTE — ED Notes (Signed)
Pt given specimen cup for urine sample

## 2023-05-22 ENCOUNTER — Emergency Department (HOSPITAL_COMMUNITY): Payer: PRIVATE HEALTH INSURANCE | Admitting: Anesthesiology

## 2023-05-22 ENCOUNTER — Encounter (HOSPITAL_COMMUNITY): Payer: Self-pay | Admitting: Urology

## 2023-05-22 ENCOUNTER — Emergency Department (HOSPITAL_COMMUNITY): Payer: PRIVATE HEALTH INSURANCE

## 2023-05-22 DIAGNOSIS — Z79899 Other long term (current) drug therapy: Secondary | ICD-10-CM | POA: Diagnosis not present

## 2023-05-22 DIAGNOSIS — R110 Nausea: Secondary | ICD-10-CM | POA: Diagnosis present

## 2023-05-22 DIAGNOSIS — N179 Acute kidney failure, unspecified: Secondary | ICD-10-CM | POA: Diagnosis present

## 2023-05-22 DIAGNOSIS — Z880 Allergy status to penicillin: Secondary | ICD-10-CM | POA: Diagnosis not present

## 2023-05-22 DIAGNOSIS — N200 Calculus of kidney: Secondary | ICD-10-CM | POA: Diagnosis present

## 2023-05-22 DIAGNOSIS — Z6832 Body mass index (BMI) 32.0-32.9, adult: Secondary | ICD-10-CM | POA: Diagnosis not present

## 2023-05-22 DIAGNOSIS — K760 Fatty (change of) liver, not elsewhere classified: Secondary | ICD-10-CM | POA: Diagnosis present

## 2023-05-22 DIAGNOSIS — N136 Pyonephrosis: Secondary | ICD-10-CM | POA: Diagnosis present

## 2023-05-22 DIAGNOSIS — Z91041 Radiographic dye allergy status: Secondary | ICD-10-CM | POA: Diagnosis not present

## 2023-05-22 DIAGNOSIS — Z9049 Acquired absence of other specified parts of digestive tract: Secondary | ICD-10-CM | POA: Diagnosis not present

## 2023-05-22 DIAGNOSIS — N390 Urinary tract infection, site not specified: Secondary | ICD-10-CM | POA: Diagnosis not present

## 2023-05-22 DIAGNOSIS — Z820 Family history of epilepsy and other diseases of the nervous system: Secondary | ICD-10-CM | POA: Diagnosis not present

## 2023-05-22 DIAGNOSIS — A419 Sepsis, unspecified organism: Secondary | ICD-10-CM | POA: Diagnosis present

## 2023-05-22 DIAGNOSIS — Z833 Family history of diabetes mellitus: Secondary | ICD-10-CM | POA: Diagnosis not present

## 2023-05-22 DIAGNOSIS — Z1152 Encounter for screening for COVID-19: Secondary | ICD-10-CM | POA: Diagnosis not present

## 2023-05-22 DIAGNOSIS — R32 Unspecified urinary incontinence: Secondary | ICD-10-CM | POA: Diagnosis present

## 2023-05-22 DIAGNOSIS — F17210 Nicotine dependence, cigarettes, uncomplicated: Secondary | ICD-10-CM | POA: Diagnosis present

## 2023-05-22 DIAGNOSIS — Z807 Family history of other malignant neoplasms of lymphoid, hematopoietic and related tissues: Secondary | ICD-10-CM | POA: Diagnosis not present

## 2023-05-22 DIAGNOSIS — Z813 Family history of other psychoactive substance abuse and dependence: Secondary | ICD-10-CM | POA: Diagnosis not present

## 2023-05-22 DIAGNOSIS — Z975 Presence of (intrauterine) contraceptive device: Secondary | ICD-10-CM | POA: Diagnosis not present

## 2023-05-22 DIAGNOSIS — Z888 Allergy status to other drugs, medicaments and biological substances status: Secondary | ICD-10-CM | POA: Diagnosis not present

## 2023-05-22 DIAGNOSIS — E669 Obesity, unspecified: Secondary | ICD-10-CM | POA: Diagnosis present

## 2023-05-22 DIAGNOSIS — Z8249 Family history of ischemic heart disease and other diseases of the circulatory system: Secondary | ICD-10-CM | POA: Diagnosis not present

## 2023-05-22 DIAGNOSIS — N1330 Unspecified hydronephrosis: Secondary | ICD-10-CM | POA: Diagnosis not present

## 2023-05-22 DIAGNOSIS — R6520 Severe sepsis without septic shock: Secondary | ICD-10-CM | POA: Diagnosis present

## 2023-05-22 DIAGNOSIS — Z823 Family history of stroke: Secondary | ICD-10-CM | POA: Diagnosis not present

## 2023-05-22 DIAGNOSIS — F32A Depression, unspecified: Secondary | ICD-10-CM | POA: Diagnosis present

## 2023-05-22 LAB — BASIC METABOLIC PANEL WITH GFR
Anion gap: 10 (ref 5–15)
BUN: 5 mg/dL — ABNORMAL LOW (ref 6–20)
CO2: 23 mmol/L (ref 22–32)
Calcium: 8.8 mg/dL — ABNORMAL LOW (ref 8.9–10.3)
Chloride: 105 mmol/L (ref 98–111)
Creatinine, Ser: 1.05 mg/dL — ABNORMAL HIGH (ref 0.44–1.00)
GFR, Estimated: >60 mL/min (ref >60)
Glucose, Bld: 160 mg/dL — ABNORMAL HIGH (ref 70–99)
Potassium: 3.9 mmol/L (ref 3.5–5.1)
Sodium: 138 mmol/L (ref 135–145)

## 2023-05-22 LAB — CBC
HCT: 38.6 % (ref 36.0–46.0)
Hemoglobin: 13.1 g/dL (ref 12.0–15.0)
MCH: 33.6 pg (ref 26.0–34.0)
MCHC: 33.9 g/dL (ref 30.0–36.0)
MCV: 99 fL (ref 80.0–100.0)
Platelets: 256 10*3/uL (ref 150–400)
RBC: 3.9 MIL/uL (ref 3.87–5.11)
RDW: 12.1 % (ref 11.5–15.5)
WBC: 15.7 10*3/uL — ABNORMAL HIGH (ref 4.0–10.5)
nRBC: 0 % (ref 0.0–0.2)

## 2023-05-22 LAB — LACTIC ACID, PLASMA: Lactic Acid, Venous: 1 mmol/L (ref 0.5–1.9)

## 2023-05-22 LAB — MAGNESIUM: Magnesium: 1.9 mg/dL (ref 1.7–2.4)

## 2023-05-22 LAB — HIV ANTIBODY (ROUTINE TESTING W REFLEX): HIV Screen 4th Generation wRfx: NONREACTIVE

## 2023-05-22 LAB — PHOSPHORUS: Phosphorus: 3 mg/dL (ref 2.5–4.6)

## 2023-05-22 MED ORDER — ONDANSETRON HCL 4 MG/2ML IJ SOLN
INTRAMUSCULAR | Status: DC | PRN
  Administered 2023-05-22: 4 mg via INTRAVENOUS

## 2023-05-22 MED ORDER — SODIUM CHLORIDE 0.9 % IV SOLN
2.0000 g | Freq: Once | INTRAVENOUS | Status: AC
  Administered 2023-05-22: 2 g via INTRAVENOUS
  Filled 2023-05-22: qty 20

## 2023-05-22 MED ORDER — SUGAMMADEX SODIUM 200 MG/2ML IV SOLN
INTRAVENOUS | Status: DC | PRN
  Administered 2023-05-22: 200 mg via INTRAVENOUS

## 2023-05-22 MED ORDER — SUCCINYLCHOLINE CHLORIDE 20 MG/ML IJ SOLN
INTRAMUSCULAR | Status: DC | PRN
Start: 2023-05-22 — End: 2023-05-22
  Administered 2023-05-22: 120 mg via INTRAVENOUS

## 2023-05-22 MED ORDER — LACTATED RINGERS IV SOLN: INTRAVENOUS | Status: AC

## 2023-05-22 MED ORDER — FENTANYL CITRATE (PF) 100 MCG/2ML IJ SOLN: 25.0000 ug | INTRAMUSCULAR | Status: DC | PRN

## 2023-05-22 MED ORDER — FENTANYL CITRATE (PF) 100 MCG/2ML IJ SOLN
INTRAMUSCULAR | Status: DC | PRN
  Administered 2023-05-22: 50 ug via INTRAVENOUS

## 2023-05-22 MED ORDER — PHENYLEPHRINE HCL-NACL 20-0.9 MG/250ML-% IV SOLN
INTRAVENOUS | Status: DC | PRN
  Administered 2023-05-22: 50 ug/min via INTRAVENOUS

## 2023-05-22 MED ORDER — MEPERIDINE HCL 25 MG/ML IJ SOLN: 6.2500 mg | INTRAMUSCULAR | Status: DC | PRN

## 2023-05-22 MED ORDER — DEXAMETHASONE SODIUM PHOSPHATE 4 MG/ML IJ SOLN
INTRAMUSCULAR | Status: DC | PRN
  Administered 2023-05-22: 10 mg via INTRAVENOUS

## 2023-05-22 MED ORDER — ESCITALOPRAM OXALATE 10 MG PO TABS
10.0000 mg | ORAL_TABLET | Freq: Every day | ORAL | Status: DC
  Administered 2023-05-22 – 2023-05-24 (×3): 10 mg via ORAL
  Filled 2023-05-22 (×3): qty 1

## 2023-05-22 MED ORDER — ACETAMINOPHEN 500 MG PO TABS
1000.0000 mg | ORAL_TABLET | Freq: Three times a day (TID) | ORAL | Status: DC
  Administered 2023-05-22 – 2023-05-24 (×7): 1000 mg via ORAL
  Filled 2023-05-22 (×7): qty 2

## 2023-05-22 MED ORDER — ALBUTEROL SULFATE HFA 108 (90 BASE) MCG/ACT IN AERS
INHALATION_SPRAY | RESPIRATORY_TRACT | Status: DC | PRN
  Administered 2023-05-22 (×2): 3 via RESPIRATORY_TRACT

## 2023-05-22 MED ORDER — PROCHLORPERAZINE EDISYLATE 10 MG/2ML IJ SOLN: 5.0000 mg | Freq: Four times a day (QID) | INTRAMUSCULAR | Status: DC | PRN

## 2023-05-22 MED ORDER — PROPOFOL 10 MG/ML IV BOLUS
INTRAVENOUS | Status: DC | PRN
  Administered 2023-05-22: 20 mg via INTRAVENOUS
  Administered 2023-05-22: 30 mg via INTRAVENOUS
  Administered 2023-05-22: 150 mg via INTRAVENOUS

## 2023-05-22 MED ORDER — LACTATED RINGERS IV SOLN: INTRAVENOUS | Status: DC | PRN

## 2023-05-22 MED ORDER — OXYBUTYNIN CHLORIDE 5 MG PO TABS
5.0000 mg | ORAL_TABLET | Freq: Three times a day (TID) | ORAL | Status: DC
  Filled 2023-05-22 (×2): qty 1

## 2023-05-22 MED ORDER — MIDAZOLAM HCL 5 MG/5ML IJ SOLN
INTRAMUSCULAR | Status: DC | PRN
Start: 2023-05-22 — End: 2023-05-22
  Administered 2023-05-22: 1 mg via INTRAVENOUS

## 2023-05-22 MED ORDER — LACTATED RINGERS IV SOLN: INTRAVENOUS | Status: DC

## 2023-05-22 MED ORDER — ROCURONIUM BROMIDE 100 MG/10ML IV SOLN
INTRAVENOUS | Status: DC | PRN
  Administered 2023-05-22: 30 mg via INTRAVENOUS

## 2023-05-22 MED ORDER — ACETAMINOPHEN 325 MG PO TABS: 650.0000 mg | ORAL_TABLET | Freq: Four times a day (QID) | ORAL | Status: DC | PRN

## 2023-05-22 MED ORDER — MIDAZOLAM HCL 2 MG/2ML IJ SOLN: 0.5000 mg | Freq: Once | INTRAMUSCULAR | Status: DC | PRN

## 2023-05-22 MED ORDER — LIDOCAINE HCL (CARDIAC) PF 50 MG/5ML IV SOSY
PREFILLED_SYRINGE | INTRAVENOUS | Status: DC | PRN
  Administered 2023-05-22: 20 mg via INTRAVENOUS

## 2023-05-22 MED ORDER — IPRATROPIUM-ALBUTEROL 0.5-2.5 (3) MG/3ML IN SOLN
RESPIRATORY_TRACT | Status: AC
  Filled 2023-05-22: qty 3

## 2023-05-22 MED ORDER — TAMSULOSIN HCL 0.4 MG PO CAPS
0.4000 mg | ORAL_CAPSULE | Freq: Every day | ORAL | Status: DC
  Administered 2023-05-22 – 2023-05-24 (×3): 0.4 mg via ORAL
  Filled 2023-05-22 (×3): qty 1

## 2023-05-22 MED ORDER — DIPHENHYDRAMINE HCL 50 MG/ML IJ SOLN
INTRAMUSCULAR | Status: DC | PRN
Start: 2023-05-22 — End: 2023-05-22
  Administered 2023-05-22: 25 mg via INTRAVENOUS

## 2023-05-22 MED ORDER — OXYCODONE HCL 5 MG PO TABS: 5.0000 mg | ORAL_TABLET | Freq: Once | ORAL | Status: DC | PRN

## 2023-05-22 MED ORDER — MELATONIN 5 MG PO TABS: 5.0000 mg | ORAL_TABLET | Freq: Every evening | ORAL | Status: DC | PRN

## 2023-05-22 MED ORDER — FOLIC ACID 1 MG PO TABS
1.0000 mg | ORAL_TABLET | Freq: Every day | ORAL | Status: DC
  Administered 2023-05-22 – 2023-05-24 (×3): 1 mg via ORAL
  Filled 2023-05-22 (×3): qty 1

## 2023-05-22 MED ORDER — MORPHINE SULFATE (PF) 2 MG/ML IV SOLN: 2.0000 mg | INTRAVENOUS | Status: DC | PRN

## 2023-05-22 MED ORDER — TRAZODONE HCL 50 MG PO TABS
50.0000 mg | ORAL_TABLET | Freq: Every evening | ORAL | Status: DC | PRN
  Administered 2023-05-22 – 2023-05-23 (×2): 50 mg via ORAL
  Filled 2023-05-22 (×2): qty 1

## 2023-05-22 MED ORDER — OXYCODONE HCL 5 MG PO TABS: 5.0000 mg | ORAL_TABLET | Freq: Four times a day (QID) | ORAL | Status: DC | PRN

## 2023-05-22 MED ORDER — POLYETHYLENE GLYCOL 3350 17 G PO PACK: 17.0000 g | PACK | Freq: Every day | ORAL | Status: DC | PRN

## 2023-05-22 MED ORDER — OXYCODONE HCL 5 MG/5ML PO SOLN: 5.0000 mg | Freq: Once | ORAL | Status: DC | PRN

## 2023-05-22 MED ORDER — NICOTINE 21 MG/24HR TD PT24
21.0000 mg | MEDICATED_PATCH | Freq: Every day | TRANSDERMAL | Status: DC
  Administered 2023-05-22 – 2023-05-24 (×3): 21 mg via TRANSDERMAL
  Filled 2023-05-22 (×3): qty 1

## 2023-05-22 MED ORDER — OXYBUTYNIN CHLORIDE 5 MG PO TABS
5.0000 mg | ORAL_TABLET | Freq: Three times a day (TID) | ORAL | Status: DC | PRN
  Administered 2023-05-22 – 2023-05-23 (×2): 5 mg via ORAL
  Filled 2023-05-22 (×2): qty 1

## 2023-05-22 MED ORDER — IPRATROPIUM-ALBUTEROL 0.5-2.5 (3) MG/3ML IN SOLN
3.0000 mL | Freq: Once | RESPIRATORY_TRACT | Status: AC
  Administered 2023-05-22: 3 mL via RESPIRATORY_TRACT

## 2023-05-22 MED ORDER — 0.9 % SODIUM CHLORIDE (POUR BTL) OPTIME
TOPICAL | Status: DC | PRN
  Administered 2023-05-22: 1000 mL

## 2023-05-22 NOTE — Progress Notes (Cosign Needed Addendum)
 1 Day Post-Op Subjective: Patient postop day 1 from left ureteral stent placement.  No acute events overnight.  Vital signs stable.  She has been afebrile.  Urine output 700 cc overnight.  Creatinine is 1.05 from 0.89.  White blood cell count still 15.7 from 15.5.   Objective: Vital signs in last 24 hours: Temp:  [98.3 F (36.8 C)-102.9 F (39.4 C)] 98.3 F (36.8 C) (04/07 0322) Pulse Rate:  [82-118] 82 (04/07 0322) Resp:  [15-33] 18 (04/07 0322) BP: (91-160)/(62-95) 115/70 (04/07 0322) SpO2:  [92 %-100 %] 93 % (04/07 0322) Weight:  [83.9 kg] 83.9 kg (04/06 1955)  Assessment/Plan: # Infected obstructing left ureteral stone -Postop day 1 status post stent -Please start Flomax for pain control. -If pain with stent, okay to do Toradol. -Follow-up urine cultures and blood cultures -Okay to remove Foley catheter today and TOV -We will arrange outpatient follow-up  Intake/Output from previous day: 04/06 0701 - 04/07 0700 In: 1077.3 [P.O.:75; I.V.:902.3; IV Piggyback:100] Out: 755 [Urine:700; Blood:5]  Intake/Output this shift: No intake/output data recorded.  Physical Exam:  General: Alert and oriented CV: No cyanosis Lungs: equal chest rise Abdomen: Soft, NTND Gu: Foley catheter in place with clear yellow urine.  No CVA tenderness.  Lab Results: Recent Labs    05/21/23 2016 05/22/23 0429  HGB 13.5 13.1  HCT 38.5 38.6   BMET Recent Labs    05/21/23 2016 05/22/23 0429  NA 137 138  K 3.5 3.9  CL 106 105  CO2 22 23  GLUCOSE 109* 160*  BUN 7 5*  CREATININE 0.89 1.05*  CALCIUM 8.7* 8.8*     Studies/Results: DG C-Arm 1-60 Min-No Report Result Date: 05/22/2023 Fluoroscopy was utilized by the requesting physician.  No radiographic interpretation.   CT Renal Stone Study Result Date: 05/21/2023 CLINICAL DATA:  Abdominal/flank pain.  Concern for kidney stone. EXAM: CT ABDOMEN AND PELVIS WITHOUT CONTRAST TECHNIQUE: Multidetector CT imaging of the abdomen and  pelvis was performed following the standard protocol without IV contrast. RADIATION DOSE REDUCTION: This exam was performed according to the departmental dose-optimization program which includes automated exposure control, adjustment of the mA and/or kV according to patient size and/or use of iterative reconstruction technique. COMPARISON:  None Available. FINDINGS: Evaluation of this exam is limited in the absence of intravenous contrast. Lower chest: The visualized lung bases are clear. No intra-abdominal free air or free fluid. Hepatobiliary: Mild fatty liver. No biliary dilatation. Cholecystectomy. No retained calcified stone noted in the central CBD. Pancreas: Unremarkable. No pancreatic ductal dilatation or surrounding inflammatory changes. Spleen: Normal in size without focal abnormality. Adrenals/Urinary Tract: The adrenal glands are unremarkable. There is a 2 mm stone in the proximal left ureter with mild left hydronephrosis. There is no hydronephrosis or nephrolithiasis on the right. Subcentimeter partially exophytic right renal interpolar hypodense lesion is not characterized on this CT, possibly a cyst. Ultrasound may provide better evaluation on a nonemergent/outpatient basis. The right ureter appears unremarkable. The urinary bladder is minimally distended and grossly unremarkable. Stomach/Bowel: There is no bowel obstruction or active inflammation. The appendix is not visualized with certainty. No inflammatory changes identified in the right lower quadrant. Vascular/Lymphatic: Mild aortoiliac atherosclerotic disease. The IVC is unremarkable. No portal venous gas. There is no adenopathy. Reproductive: The uterus is anteverted. An intrauterine device is noted. No suspicious adnexal masses. Other: None Musculoskeletal: No acute or significant osseous findings. IMPRESSION: 1. A 2 mm proximal left ureteral stone with mild left hydronephrosis. 2. No bowel obstruction.  3. Mild fatty liver. Electronically  Signed   By: Elgie Collard M.D.   On: 05/21/2023 20:39      LOS: 0 days   Jerald Kief, MD, PhD [facility] Resident  [facility]    05/22/2023, 7:22 AM

## 2023-05-22 NOTE — Progress Notes (Signed)
 PROGRESS NOTE                                                                                                                                                                                                             Patient Demographics:    Valerie Merritt, is a 53 y.o. female, DOB - 1970-07-22, ZOX:[id number]  Outpatient Primary MD for the patient is Erskine Emery, NP    LOS - 0  Admit date - 05/21/2023    Chief Complaint  Patient presents with   Fever       Brief Narrative (HPI from H&P)   53 y.o. female with medical history significant for obesity, initially seen at urgent care for concern for possible kidney stone.  UA was positive for pyuria and the patient was started on Macrobid.  The patient developed a fever with chills that started today.  Associated with nausea without vomiting.  The patient presented to [facility] ED for further evaluation.   In the ER, CT scan done and showed 2 mm obstructing ureteral stone.  The patient was started on IV antibiotics.  EDP discussed the case with urology recommended transfer to [facility] ER to ER for ureteral stent placement.  EDP requested admission.  Admitted by [facility], hospitalist service.  Accepted by Dr. Antionette Char and transferred to [facility] ER to ER.   The patient is POD #0, status post cystoscopy and left ureteral stent placement on 05/22/2023 by Dr. Shelby Dubin.  Postop the patient was transferred to progressive care unit as inpatient status.   Subjective:    [place] today has, No headache, No chest pain, No abdominal pain - No Nausea, No new weakness tingling or numbness, no SOB   Assessment  & Plan :   Sepsis secondary to complicated UTI, secondary to left obstructing ureteral stone present on admission.  Mild AKI Has been seen by urology, s/p cystoscopy with left ureteral stent placement on 05/22/2023 by Dr. Shelby Dubin.  On empiric antibiotics, follow cultures,  continue IV fluids for hydration, advance activity and monitor.   History of depression.  Continue Lexapro.        Condition - Fair  Family Communication  :  None present  Code Status :  Full  Consults  :  Urology  PUD Prophylaxis :    Procedures  :  CT - 1. A 2 mm proximal left ureteral stone with mild left hydronephrosis. 2. No bowel obstruction. 3. Mild fatty liver.      Disposition Plan  :    Status is: Inpatient  DVT Prophylaxis  :    SCDs Start: 05/22/23 0237    Lab Results  Component Value Date   PLT 256 05/22/2023    Diet :  Diet Order             Diet regular Room service appropriate? Yes; Fluid consistency: Thin  Diet effective now                    Inpatient Medications  Scheduled Meds:  acetaminophen  1,000 mg Oral Q8H   ipratropium-albuterol       tamsulosin  0.4 mg Oral Daily   Continuous Infusions:  sodium chloride Stopped (05/21/23 2135)   cefTRIAXone (ROCEPHIN)  IV     lactated ringers 75 mL/hr at 05/22/23 0322   PRN Meds:.sodium chloride, ipratropium-albuterol, melatonin, morphine injection, oxyCODONE, polyethylene glycol, prochlorperazine    Objective:   Vitals:   05/22/23 0322 05/22/23 0500 05/22/23 0700 05/22/23 0820  BP: 115/70   119/76  Pulse: 82 64 65 62  Resp: 18 (!) 22 19 16   Temp: 98.3 F (36.8 C)   (!) 97.5 F (36.4 C)  TempSrc: Oral   Oral  SpO2: 93% 94% 93% 98%  Weight:      Height:        Wt Readings from Last 3 Encounters:  05/21/23 83.9 kg  08/29/22 84.8 kg  08/12/19 88.5 kg     Intake/Output Summary (Last 24 hours) at 05/22/2023 0829 Last data filed at 05/22/2023 0542 Gross per 24 hour  Intake 1077.33 ml  Output 755 ml  Net 322.33 ml     Physical Exam  Awake Alert, No new F.N deficits, Normal affect [place].AT,PERRAL Supple Neck, No JVD,   Symmetrical Chest wall movement, Good air movement bilaterally, CTAB RRR,No Gallops,Rubs or new Murmurs,  +ve B.Sounds, Abd Soft, No tenderness,    No Cyanosis, Clubbing or edema      Data Review:    Recent Labs  Lab 05/21/23 2016 05/22/23 0429  WBC 15.5* 15.7*  HGB 13.5 13.1  HCT 38.5 38.6  PLT 255 256  MCV 96.3 99.0  MCH 33.8 33.6  MCHC 35.1 33.9  RDW 12.0 12.1  LYMPHSABS 2.1  --   MONOABS 1.1*  --   EOSABS 0.0  --   BASOSABS 0.0  --     Recent Labs  Lab 05/21/23 2016 05/22/23 0429 05/22/23 0431  NA 137 138  --   K 3.5 3.9  --   CL 106 105  --   CO2 22 23  --   ANIONGAP 9 10  --   GLUCOSE 109* 160*  --   BUN 7 5*  --   CREATININE 0.89 1.05*  --   AST 15  --   --   ALT 20  --   --   ALKPHOS 81  --   --   BILITOT 0.9  --   --   ALBUMIN 3.6  --   --   LATICACIDVEN 1.0  --  1.0  MG  --  1.9  --   PHOS  --  3.0  --   CALCIUM 8.7* 8.8*  --       Recent Labs  Lab 05/21/23 2016 05/22/23 0429 05/22/23 0431  LATICACIDVEN 1.0  --  1.0  MG  --  1.9  --   CALCIUM 8.7* 8.8*  --     --------------------------------------------------------------------------------------------------------------- Lab Results  Component Value Date   CHOL 186 12/26/2013   HDL 38 (L) 12/26/2013   LDLCALC 121 (H) 12/26/2013   TRIG 135 12/26/2013   CHOLHDL 4.9 12/26/2013    No results found for: "HGBA1C" No results for input(s): "TSH", "T4TOTAL", "FREET4", "T3FREE", "THYROIDAB" in the last 72 hours. No results for input(s): "VITAMINB12", "FOLATE", "FERRITIN", "TIBC", "IRON", "RETICCTPCT" in the last 72 hours. ------------------------------------------------------------------------------------------------------------------ Cardiac Enzymes No results for input(s): "CKMB", "TROPONINI", "MYOGLOBIN" in the last 168 hours.  Invalid input(s): "CK"  Micro Results Recent Results (from the past 240 hours)  Culture, blood (routine x 2)     Status: None (Preliminary result)   Collection Time: 05/21/23  8:10 PM   Specimen: BLOOD RIGHT WRIST  Result Value Ref Range Status   Specimen Description   Final    BLOOD RIGHT  WRIST Performed at [facility], [address] [place], [place] [address]    Special Requests   Final    BOTTLES DRAWN AEROBIC AND ANAEROBIC Blood Culture adequate volume Performed at [facility], [address] [place], [place] [address]    Culture   Final    NO GROWTH < 12 HOURS Performed at [facility], [address], [place], [place] [address]    Report Status PENDING  Incomplete  Culture, blood (routine x 2)     Status: None (Preliminary result)   Collection Time: 05/21/23  8:15 PM   Specimen: BLOOD LEFT FOREARM  Result Value Ref Range Status   Specimen Description   Final    BLOOD LEFT FOREARM Performed at [facility], [address] [place], [place] [address]    Special Requests   Final    BOTTLES DRAWN AEROBIC AND ANAEROBIC Blood Culture results may not be optimal due to an inadequate volume of blood received in culture bottles Performed at [facility], [address] [place], [place] [address]    Culture   Final    NO GROWTH < 12 HOURS Performed at [facility], [address], [place], [place] [address]    Report Status PENDING  Incomplete  Resp panel by RT-PCR (RSV, Flu A&B, Covid) Anterior Nasal Swab     Status: None   Collection Time: 05/21/23  9:37 PM   Specimen: Anterior Nasal Swab  Result Value Ref Range Status   SARS Coronavirus 2 by RT PCR NEGATIVE NEGATIVE Final    Comment: (NOTE) SARS-CoV-2 target nucleic acids are NOT DETECTED.  The SARS-CoV-2 RNA is generally detectable in upper respiratory specimens during the acute phase of infection. The lowest concentration of SARS-CoV-2 viral copies this assay can detect is 138 copies/mL. A negative result does not preclude SARS-Cov-2 infection and should not be used as the sole basis for treatment or other patient management decisions. A negative result may occur with  improper specimen collection/handling, submission of specimen other than  nasopharyngeal swab, presence of viral mutation(s) within the areas targeted by this assay, and inadequate number of viral copies(<138 copies/mL). A negative result must be combined with clinical observations, patient history, and epidemiological information. The expected result is Negative.  Fact Sheet for Patients:  BloggerCourse.com  Fact Sheet for Healthcare Providers:  SeriousBroker.it  This test is no t yet approved or cleared by the [place] and  has been authorized for detection and/or diagnosis of SARS-CoV-2 by FDA under an Emergency Use Authorization (EUA). This EUA will remain  in effect (meaning this test can be used) for the duration of the COVID-19 declaration under Section 564(b)(1) of the Act, 21 U.S.C.section 360bbb-3(b)(1), unless the authorization is terminated  or revoked sooner.       Influenza A by PCR NEGATIVE NEGATIVE Final   Influenza B by PCR NEGATIVE NEGATIVE Final    Comment: (NOTE) The Xpert Xpress SARS-CoV-2/FLU/RSV plus assay is intended as an aid in the diagnosis of influenza from Nasopharyngeal swab specimens and should not be used as a sole basis for treatment. Nasal washings and aspirates are unacceptable for Xpert Xpress SARS-CoV-2/FLU/RSV testing.  Fact Sheet for Patients: BloggerCourse.com  Fact Sheet for Healthcare Providers: SeriousBroker.it  This test is not yet approved or cleared by the [place] FDA and has been authorized for detection and/or diagnosis of SARS-CoV-2 by FDA under an Emergency Use Authorization (EUA). This EUA will remain in effect (meaning this test can be used) for the duration of the COVID-19 declaration under Section 564(b)(1) of the Act, 21 U.S.C. section 360bbb-3(b)(1), unless the authorization is terminated or revoked.     Resp Syncytial Virus by PCR NEGATIVE NEGATIVE Final    Comment:  (NOTE) Fact Sheet for Patients: BloggerCourse.com  Fact Sheet for Healthcare Providers: SeriousBroker.it  This test is not yet approved or cleared by the [place] FDA and has been authorized for detection and/or diagnosis of SARS-CoV-2 by FDA under an Emergency Use Authorization (EUA). This EUA will remain in effect (meaning this test can be used) for the duration of the COVID-19 declaration under Section 564(b)(1) of the Act, 21 U.S.C. section 360bbb-3(b)(1), unless the authorization is terminated or revoked.  Performed at [facility], [address] [place], [place] [address]     Radiology Report DG C-Arm 1-60 Min-No Report Result Date: 05/22/2023 Fluoroscopy was utilized by the requesting physician.  No radiographic interpretation.   CT Renal Stone Study Result Date: 05/21/2023 CLINICAL DATA:  Abdominal/flank pain.  Concern for kidney stone. EXAM: CT ABDOMEN AND PELVIS WITHOUT CONTRAST TECHNIQUE: Multidetector CT imaging of the abdomen and pelvis was performed following the standard protocol without IV contrast. RADIATION DOSE REDUCTION: This exam was performed according to the departmental dose-optimization program which includes automated exposure control, adjustment of the mA and/or kV according to patient size and/or use of iterative reconstruction technique. COMPARISON:  None Available. FINDINGS: Evaluation of this exam is limited in the absence of intravenous contrast. Lower chest: The visualized lung bases are clear. No intra-abdominal free air or free fluid. Hepatobiliary: Mild fatty liver. No biliary dilatation. Cholecystectomy. No retained calcified stone noted in the central CBD. Pancreas: Unremarkable. No pancreatic ductal dilatation or surrounding inflammatory changes. Spleen: Normal in size without focal abnormality. Adrenals/Urinary Tract: The adrenal glands are unremarkable. There is a 2 mm stone in the  proximal left ureter with mild left hydronephrosis. There is no hydronephrosis or nephrolithiasis on the right. Subcentimeter partially exophytic right renal interpolar hypodense lesion is not characterized on this CT, possibly a cyst. Ultrasound may provide better evaluation on a nonemergent/outpatient basis. The right ureter appears unremarkable. The urinary bladder is minimally distended and grossly unremarkable. Stomach/Bowel: There is no bowel obstruction or active inflammation. The appendix is not visualized with certainty. No inflammatory changes identified in the right lower quadrant. Vascular/Lymphatic: Mild aortoiliac atherosclerotic disease. The IVC is unremarkable. No portal venous gas. There is no  adenopathy. Reproductive: The uterus is anteverted. An intrauterine device is noted. No suspicious adnexal masses. Other: None Musculoskeletal: No acute or significant osseous findings. IMPRESSION: 1. A 2 mm proximal left ureteral stone with mild left hydronephrosis. 2. No bowel obstruction. 3. Mild fatty liver. Electronically Signed   By: Elgie Collard M.D.   On: 05/21/2023 20:39     Signature  -   Susa Raring M.D on 05/22/2023 at 8:29 AM   -  To page go to [email or web address]

## 2023-05-22 NOTE — Plan of Care (Signed)
  Problem: Clinical Measurements: Goal: Diagnostic test results will improve Outcome: Progressing   Problem: Activity: Goal: Risk for activity intolerance will decrease Outcome: Progressing   Problem: Nutrition: Goal: Adequate nutrition will be maintained Outcome: Progressing   Problem: Coping: Goal: Level of anxiety will decrease Outcome: Progressing   Problem: Elimination: Goal: Will not experience complications related to bowel motility Outcome: Progressing Goal: Will not experience complications related to urinary retention Outcome: Progressing   Problem: Pain Managment: Goal: General experience of comfort will improve and/or be controlled Outcome: Progressing

## 2023-05-22 NOTE — H&P (Signed)
 History and Physical  Valerie Merritt GHW:[id number] DOB: [birth year] DOA: 05/21/2023  Referring physician: Accepted by Dr. Sherrell Puller, hospitalist service. PCP: Erskine Emery, NP  Outpatient Specialists: Gynecology, podiatry. Patient coming from: Home through [facility] ED.  Chief Complaint: Left flank pain.  HPI: Valerie Merritt is a 53 y.o. female with medical history significant for obesity, initially seen at urgent care for concern for possible kidney stone.  UA was positive for pyuria and the patient was started on Macrobid.  The patient developed a fever with chills that started today.  Associated with nausea without vomiting.  The patient presented to [facility] ED for further evaluation.  In the ER, CT scan done and showed 2 mm obstructing ureteral stone.  The patient was started on IV antibiotics.  EDP discussed the case with urology recommended transfer to [facility] ER to ER for ureteral stent placement.  EDP requested admission.  Admitted by [facility], hospitalist service.  Accepted by Dr. Antionette Char and transferred to [facility] ER to ER.  The patient is POD #0, status post cystoscopy and left ureteral stent placement on 05/22/2023 by Dr. Shelby Dubin.  Postop the patient was transferred to progressive care unit as inpatient status.  The patient was seen and examined after her procedure.  She has no new complaints at this time.  ED Course: Tmax 101.6.  BP 106/70, pulse 63, respiration rate 25, O2 saturation 93% on room air.  Lab studies notable for WBC 15.7, hemoglobin 13.1, platelet count 256.  Lactic acid 1.0.  Review of Systems: Review of systems as noted in the HPI. All other systems reviewed and are negative.   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     Past Surgical History:  Procedure Laterality Date   APPENDECTOMY  1985   CHOLECYSTECTOMY N/A 02/14/2014   Procedure: LAPAROSCOPIC CHOLECYSTECTOMY;  Surgeon: Harriette Bouillon, MD;  Location: [facility] OR;  Service: General;  Laterality: N/A;   INTRAUTERINE DEVICE (IUD) INSERTION  12/2011   URETHRAL DILATION      Social History:  reports that she has been smoking cigarettes. She has a 25 pack-year smoking history. She has never used smokeless tobacco. She reports that she does not drink alcohol and does not use drugs.   Allergies  Allergen Reactions   Dulera [Mometasone Furo-Formoterol Fum] Shortness Of Breath and Palpitations    SOB and tachycardia   Ivp Dye [Iodinated Contrast Media] Anaphylaxis   Penicillins Rash    Family History  Problem Relation Age of Onset   Diabetes Mother    Hypertension Mother    Heart disease Father    Multiple sclerosis Sister    Cancer Sister        Brain    Cancer Daughter        Hodgkin's Lymphoma    Stroke Maternal Grandmother    Hypertension Maternal Grandmother    Cancer - Colon Maternal Grandfather    Cancer Maternal Grandfather 50       Colon    Hepatitis C Brother    Other Brother        Avascular Necrosis   Drug abuse Brother  Crack, Pain Meds    Breast cancer Neg Hx       Prior to Admission medications   Medication Sig Start Date End Date Taking? Authorizing Provider  escitalopram (LEXAPRO) 10 MG tablet Take 10 mg by mouth daily.    [provider]  estradiol (VIVELLE-DOT) 0.025 MG/24HR Place 1 patch onto the skin 2 (two) times a week. 08/29/22   Lesly Dukes, MD  folic acid (FOLVITE) 1 MG tablet Take 1 mg by mouth daily.    [provider]  levonorgestrel (MIRENA) 20 MCG/DAY IUD 1 each by Intrauterine route once.    [provider]  NON FORMULARY Hot Momma Perimenopause and menopausal relief    [provider]  Pumpkin Seed-Soy Germ (AZO BLADDER CONTROL/GO-LESS PO) Take by  mouth.    [provider]    Physical Exam: BP 115/70 (BP Location: Left Arm)   Pulse 82   Temp 98.3 F (36.8 C) (Oral)   Resp 18   Ht 5\' 4"  (1.626 m)   Wt 83.9 kg   SpO2 93%   BMI 31.76 kg/m   General: 53 y.o. year-old female well developed well nourished in no acute distress.  Alert and oriented x3. Cardiovascular: Regular rate and rhythm with no rubs or gallops.  No thyromegaly or JVD noted.  No lower extremity edema. 2/4 pulses in all 4 extremities. Respiratory: Clear to auscultation with no wheezes or rales. Good inspiratory effort. Abdomen: Soft nontender nondistended with normal bowel sounds x4 quadrants. Muskuloskeletal: No cyanosis, clubbing or edema noted bilaterally Neuro: CN II-XII intact, strength, sensation, reflexes Skin: No ulcerative lesions noted or rashes Psychiatry: Judgement and insight appear normal. Mood is appropriate for condition and setting          Labs on Admission:  Basic Metabolic Panel: Recent Labs  Lab 05/21/23 2016  NA 137  K 3.5  CL 106  CO2 22  GLUCOSE 109*  BUN 7  CREATININE 0.89  CALCIUM 8.7*   Liver Function Tests: Recent Labs  Lab 05/21/23 2016  AST 15  ALT 20  ALKPHOS 81  BILITOT 0.9  PROT 7.3  ALBUMIN 3.6   No results for input(s): "LIPASE", "AMYLASE" in the last 168 hours. No results for input(s): "AMMONIA" in the last 168 hours. CBC: Recent Labs  Lab 05/21/23 2016 05/22/23 0429  WBC 15.5* 15.7*  NEUTROABS 12.2*  --   HGB 13.5 13.1  HCT 38.5 38.6  MCV 96.3 99.0  PLT 255 256   Cardiac Enzymes: No results for input(s): "CKTOTAL", "CKMB", "CKMBINDEX", "TROPONINI" in the last 168 hours.  BNP (last 3 results) No results for input(s): "BNP" in the last 8760 hours.  ProBNP (last 3 results) No results for input(s): "PROBNP" in the last 8760 hours.  CBG: No results for input(s): "GLUCAP" in the last 168 hours.  Radiological Exams on Admission: DG C-Arm 1-60 Min-No Report Result Date:  05/22/2023 Fluoroscopy was utilized by the requesting physician.  No radiographic interpretation.   CT Renal Stone Study Result Date: 05/21/2023 CLINICAL DATA:  Abdominal/flank pain.  Concern for kidney stone. EXAM: CT ABDOMEN AND PELVIS WITHOUT CONTRAST TECHNIQUE: Multidetector CT imaging of the abdomen and pelvis was performed following the standard protocol without IV contrast. RADIATION DOSE REDUCTION: This exam was performed according to the departmental dose-optimization program which includes automated exposure control, adjustment of the mA and/or kV according to patient size and/or use of iterative reconstruction technique. COMPARISON:  None Available. FINDINGS: Evaluation of this exam is limited  in the absence of intravenous contrast. Lower chest: The visualized lung bases are clear. No intra-abdominal free air or free fluid. Hepatobiliary: Mild fatty liver. No biliary dilatation. Cholecystectomy. No retained calcified stone noted in the central CBD. Pancreas: Unremarkable. No pancreatic ductal dilatation or surrounding inflammatory changes. Spleen: Normal in size without focal abnormality. Adrenals/Urinary Tract: The adrenal glands are unremarkable. There is a 2 mm stone in the proximal left ureter with mild left hydronephrosis. There is no hydronephrosis or nephrolithiasis on the right. Subcentimeter partially exophytic right renal interpolar hypodense lesion is not characterized on this CT, possibly a cyst. Ultrasound may provide better evaluation on a nonemergent/outpatient basis. The right ureter appears unremarkable. The urinary bladder is minimally distended and grossly unremarkable. Stomach/Bowel: There is no bowel obstruction or active inflammation. The appendix is not visualized with certainty. No inflammatory changes identified in the right lower quadrant. Vascular/Lymphatic: Mild aortoiliac atherosclerotic disease. The IVC is unremarkable. No portal venous gas. There is no adenopathy.  Reproductive: The uterus is anteverted. An intrauterine device is noted. No suspicious adnexal masses. Other: None Musculoskeletal: No acute or significant osseous findings. IMPRESSION: 1. A 2 mm proximal left ureteral stone with mild left hydronephrosis. 2. No bowel obstruction. 3. Mild fatty liver. Electronically Signed   By: Elgie Collard M.D.   On: 05/21/2023 20:39    EKG: I independently viewed the EKG done and my findings are as followed: None available at the time of this visit.  Assessment/Plan Present on Admission:  Sepsis secondary to UTI [facility])  Principal Problem:   Sepsis secondary to UTI (HCC)  Sepsis secondary to complicated UTI, POA Presented with leukocytosis, tachypnea, left obstructing ureteral stone with infection Started on Rocephin empirically in the ER, continue Follow urine culture for ID and sensitivities Follow peripheral blood cultures x 2 De-escalate antibiotics when able Monitor fever curve and WBCs Maintain MAP greater than 65  Left obstructing ureteral stone with infection, POA Status post cystoscopy and left ureteral stent placement on 05/22/2023 by urology Dr. Shelby Dubin. As needed analgesics Gentle IV fluid hydration. Tamsulosin  Obesity BMI 21 Recommend weight loss outpatient with regular physical activity and healthy dieting.   Time: 75 minutes.   DVT prophylaxis: Defer chemical DVT prophylaxis to urology.  Code Status: Full code  Family Communication: None at bedside.  Disposition Plan: Admitted to progressive care unit by Dr. Antionette Char [facility], hospitalist service.  Consults called: Urology.  Admission status: Inpatient status.   Status is: Inpatient The patient requires at least 2 midnights for further evaluation and treatment of present condition.   Darlin Drop MD [facility] Pager [phone]  If 7PM-7AM, please contact night-coverage [email or web address] Password [email or web address]  05/22/2023, 5:10 AM

## 2023-05-22 NOTE — Progress Notes (Signed)
 Physical Therapy Note  Patient is functioning at a high level of independence and no physical therapy is indicated at this time. Feels that function has returned close to baseline. DGI score low fall risk 22/24. PT is signing-off. Please re-order if there is any significant change in status. Thank you for this referral.  Kathlyn Sacramento, PT, DPT [facility]  Rehabilitation Services Physical Therapist Office: [phone] Website: [email or web address]

## 2023-05-22 NOTE — Transfer of Care (Signed)
 Immediate Anesthesia Transfer of Care Note  Patient: Valerie Merritt  Procedure(s) Performed: CYSTOSCOPY, WITH STENT INSERTION (Left: Ureter)  Patient Location: PACU  Anesthesia Type:General  Level of Consciousness: awake  Airway & Oxygen Therapy: Patient Spontanous Breathing and Patient connected to face mask oxygen  Post-op Assessment: Report given to RN and Post -op Vital signs reviewed and stable  Post vital signs: Reviewed and stable  Last Vitals:  Vitals Value Taken Time  BP 102/64 05/22/23 0104  Temp    Pulse 99 05/22/23 0107  Resp 29 05/22/23 0107  SpO2 94 % 05/22/23 0107  Vitals shown include unfiled device data.  Last Pain:  Vitals:   05/22/23 0106  TempSrc:   PainSc: 0-No pain         Complications: No notable events documented.

## 2023-05-22 NOTE — Op Note (Signed)
 Preoperative diagnosis:  Left obstructing ureteral stone with infection   Postoperative diagnosis:  same   Procedure:  Cystoscopy left ureteral stent placement   Surgeon: Shelby Dubin, MD  Assistant: Jerald Kief, MD, PhD - resident assisting.   Anesthesia: General  Complications: None  Intraoperative findings: Normal urethra Normal bladder Scout film with no obvious stone Hydronephrotic drip clear, proximal culture collected Successful left ureteral stent placement 6Fr x 26 cm JJ  EBL: Minimal  Specimens: None  Indication: Valerie Merritt is a 53 y.o. patient with 2 mm proximal left obstructing stone, fever and chills concerning for stone. After reviewing the management options for treatment, he elected to proceed with the above surgical procedure(s). We have discussed the potential benefits and risks of the procedure, side effects of the proposed treatment, the likelihood of the patient achieving the goals of the procedure, and any potential problems that might occur during the procedure or recuperation. Informed consent has been obtained.  Description of procedure:  The patient was taken to the operating room and general anesthesia was induced.  The patient was placed in the dorsal lithotomy position, prepped and draped in the usual sterile fashion, and preoperative antibiotics were administered. A preoperative time-out was performed.   Cystourethroscopy was performed.  The patient's urethra was examined and was normal. The bladder was then systematically examined in its entirety. There was no evidence for any bladder tumors, stones, or other mucosal pathology.    Attention then turned to the leftureteral orifice and a ureteral catheter was used to intubate the ureteral orifice. Wire was placed in renal pelvis under fluoroscopic guidance.  5 french open ended was then advanced to renal pelvis. There was a clear hydronephrosis drip. Urine was aspirated via syringe  and sent for culture. Wire was replaced and 5 french open ended was removed.   The wire was then backloaded through the cystoscope and a ureteral stent was advance over the wire using Seldinger technique.  The stent was positioned appropriately under fluoroscopic and cystoscopic guidance.  The wire was then removed with an adequate stent curl noted in the renal pelvis as well as in the bladder. The bladder was kept full and a foley catheter was placed with 10 cc of sterile water in the balloon.    The patient appeared to tolerate the procedure well and without complications.  The patient was able to be awakened and transferred to the recovery unit in satisfactory condition.   Plan: -Continue foley cathter until patient is afebrile for 12-24 hours -continue rocephin 2 gram every 24 hours -follow up cultures -Ok for regular diet -please start Flomax. Ok for Toradol for pain control.  -We will schedule definitive stone treatment for patient at discharge.   Jerald Kief, MD, PhD [facility] Resident  [facility] Urology

## 2023-05-22 NOTE — Plan of Care (Signed)
  Problem: Clinical Measurements: Goal: Ability to maintain clinical measurements within normal limits will improve Outcome: Progressing Goal: Will remain free from infection Outcome: Progressing   Problem: Activity: Goal: Risk for activity intolerance will decrease Outcome: Progressing   Problem: Pain Managment: Goal: General experience of comfort will improve and/or be controlled Outcome: Progressing

## 2023-05-22 NOTE — Anesthesia Postprocedure Evaluation (Signed)
 Anesthesia Post Note  Patient: Karliah L Malay  Procedure(s) Performed: CYSTOSCOPY, WITH STENT INSERTION (Left: Ureter)     Patient location during evaluation: PACU Anesthesia Type: General Level of consciousness: awake and alert, patient cooperative and oriented Pain management: pain level controlled Vital Signs Assessment: post-procedure vital signs reviewed and stable Respiratory status: spontaneous breathing, nonlabored ventilation and respiratory function stable Cardiovascular status: blood pressure returned to baseline and stable Postop Assessment: no apparent nausea or vomiting Anesthetic complications: no   No notable events documented.  Last Vitals:  Vitals:   05/22/23 0120 05/22/23 0135  BP: 101/65 99/62  Pulse: 98 96  Resp: 16 (!) 24  Temp:    SpO2: 92% 93%    Last Pain:  Vitals:   05/22/23 0135  TempSrc:   PainSc: 0-No pain                 Shelma Eiben,E. Brand Siever

## 2023-05-22 NOTE — Anesthesia Procedure Notes (Deleted)
 Procedure Name: Intubation Date/Time: 05/22/2023 12:15 AM  Performed by: Edmonia Caprio, CRNAPre-anesthesia Checklist: Patient identified, Emergency Drugs available, Suction available, Patient being monitored and Timeout performed Patient Re-evaluated:Patient Re-evaluated prior to induction Oxygen Delivery Method: Circle system utilized Preoxygenation: Pre-oxygenation with 100% oxygen Induction Type: IV induction and Rapid sequence Laryngoscope Size: Miller, 2, Mac and 3 Grade View: Grade I Tube type: Oral Tube size: 7.0 mm Number of attempts: 2 Airway Equipment and Method: Stylet Placement Confirmation: ETT inserted through vocal cords under direct vision, positive ETCO2 and breath sounds checked- equal and bilateral Secured at: 21 cm Tube secured with: Tape Dental Injury: Teeth and Oropharynx as per pre-operative assessment  Comments: DL 1 AAuston CRNA Miller 2, dropped epiglottis DL 2 Jackson MD Mac 3, grade 1 view

## 2023-05-22 NOTE — Anesthesia Procedure Notes (Signed)
 Procedure Name: Intubation Date/Time: 05/22/2023 12:15 AM  Performed by: Jairo Ben, MDPre-anesthesia Checklist: Patient identified, Emergency Drugs available, Suction available and Patient being monitored Patient Re-evaluated:Patient Re-evaluated prior to induction Oxygen Delivery Method: Circle system utilized Preoxygenation: Pre-oxygenation with 100% oxygen Induction Type: IV induction Ventilation: Mask ventilation without difficulty Laryngoscope Size: 3 and Mac Grade View: Grade I Tube type: Oral Tube size: 7.5 mm Number of attempts: 1 (CRNA unable to visualize with Hyacinth Meeker 2) Airway Equipment and Method: Stylet Placement Confirmation: ETT inserted through vocal cords under direct vision, positive ETCO2 and breath sounds checked- equal and bilateral Secured at: 22 cm Tube secured with: Tape Dental Injury: Teeth and Oropharynx as per pre-operative assessment

## 2023-05-23 ENCOUNTER — Inpatient Hospital Stay (HOSPITAL_COMMUNITY): Payer: PRIVATE HEALTH INSURANCE

## 2023-05-23 DIAGNOSIS — A419 Sepsis, unspecified organism: Secondary | ICD-10-CM | POA: Diagnosis not present

## 2023-05-23 DIAGNOSIS — N390 Urinary tract infection, site not specified: Secondary | ICD-10-CM | POA: Diagnosis not present

## 2023-05-23 LAB — CBC WITH DIFFERENTIAL/PLATELET
Abs Immature Granulocytes: 0.07 10*3/uL (ref 0.00–0.07)
Basophils Absolute: 0 10*3/uL (ref 0.0–0.1)
Basophils Relative: 0 %
Eosinophils Absolute: 0 10*3/uL (ref 0.0–0.5)
Eosinophils Relative: 0 %
HCT: 37.1 % (ref 36.0–46.0)
Hemoglobin: 12.8 g/dL (ref 12.0–15.0)
Immature Granulocytes: 1 %
Lymphocytes Relative: 23 %
Lymphs Abs: 3.6 10*3/uL (ref 0.7–4.0)
MCH: 33.5 pg (ref 26.0–34.0)
MCHC: 34.5 g/dL (ref 30.0–36.0)
MCV: 97.1 fL (ref 80.0–100.0)
Monocytes Absolute: 0.8 10*3/uL (ref 0.1–1.0)
Monocytes Relative: 5 %
Neutro Abs: 10.9 10*3/uL — ABNORMAL HIGH (ref 1.7–7.7)
Neutrophils Relative %: 71 %
Platelets: 264 10*3/uL (ref 150–400)
RBC: 3.82 MIL/uL — ABNORMAL LOW (ref 3.87–5.11)
RDW: 12 % (ref 11.5–15.5)
WBC: 15.3 10*3/uL — ABNORMAL HIGH (ref 4.0–10.5)
nRBC: 0 % (ref 0.0–0.2)

## 2023-05-23 LAB — URINE CULTURE: Culture: NO GROWTH

## 2023-05-23 LAB — BASIC METABOLIC PANEL WITH GFR
Anion gap: 10 (ref 5–15)
BUN: 9 mg/dL (ref 6–20)
CO2: 24 mmol/L (ref 22–32)
Calcium: 9.3 mg/dL (ref 8.9–10.3)
Chloride: 107 mmol/L (ref 98–111)
Creatinine, Ser: 0.89 mg/dL (ref 0.44–1.00)
GFR, Estimated: >60 mL/min (ref >60)
Glucose, Bld: 123 mg/dL — ABNORMAL HIGH (ref 70–99)
Potassium: 3.9 mmol/L (ref 3.5–5.1)
Sodium: 141 mmol/L (ref 135–145)

## 2023-05-23 LAB — MAGNESIUM: Magnesium: 2 mg/dL (ref 1.7–2.4)

## 2023-05-23 LAB — PROCALCITONIN: Procalcitonin: 0.11 ng/mL

## 2023-05-23 MED ORDER — AMLODIPINE BESYLATE 10 MG PO TABS
10.0000 mg | ORAL_TABLET | Freq: Every day | ORAL | Status: DC
  Administered 2023-05-23 – 2023-05-24 (×2): 10 mg via ORAL
  Filled 2023-05-23 (×3): qty 1

## 2023-05-23 MED ORDER — ALUM & MAG HYDROXIDE-SIMETH 200-200-20 MG/5ML PO SUSP
30.0000 mL | Freq: Four times a day (QID) | ORAL | Status: DC | PRN
  Administered 2023-05-23: 30 mL via ORAL
  Filled 2023-05-23: qty 30

## 2023-05-23 MED ORDER — HYOSCYAMINE SULFATE 0.125 MG SL SUBL
0.2500 mg | SUBLINGUAL_TABLET | SUBLINGUAL | Status: DC | PRN
Start: 2023-05-23 — End: 2023-05-24
  Administered 2023-05-23 – 2023-05-24 (×3): 0.25 mg via SUBLINGUAL
  Filled 2023-05-23 (×6): qty 2

## 2023-05-23 NOTE — Plan of Care (Signed)
  Problem: Clinical Measurements: Goal: Ability to maintain clinical measurements within normal limits will improve Outcome: Progressing Goal: Will remain free from infection Outcome: Progressing   Problem: Coping: Goal: Level of anxiety will decrease Outcome: Progressing   Problem: Pain Managment: Goal: General experience of comfort will improve and/or be controlled Outcome: Progressing

## 2023-05-23 NOTE — Progress Notes (Signed)
 2 Days Post-Op Subjective: Patient has ongoing bladder spasm and urinary leakage.  Reviewed case and plan with patient and her significant other  Objective: Vital signs in last 24 hours: Temp:  [97.6 F (36.4 C)-98.2 F (36.8 C)] 97.7 F (36.5 C) (04/08 1100) Pulse Rate:  [50-65] 60 (04/08 1100) Resp:  [17-19] 17 (04/08 0325) BP: (127-150)/(74-102) 142/85 (04/08 1100) SpO2:  [95 %-99 %] 99 % (04/08 1100)  Assessment/Plan: # Ureteral stone To the OR for cystoscopy with left ureteral stent placement- Dr.Hall Definitive stone management on an outpatient basis once infection is cleared. BC/UC NGTD, however patient received ABX prior to presenting to the hospital.  She has requested that her sensitivities be faxed over to the unit. Omnicef for 10 days of total treatment on discharge  # Bladder spasm Refractory to Ditropan.  Changed to hyoscyamine Stat KUB Ongoing urinary leakage.   Intake/Output from previous day: 04/07 0701 - 04/08 0700 In: -  Out: 2050 [Urine:2050]  Intake/Output this shift: No intake/output data recorded.  Physical Exam:  General: Alert and oriented CV: No cyanosis Lungs: equal chest rise Abdomen: Soft, NTND, no rebound or guarding   Lab Results: Recent Labs    05/21/23 2016 05/22/23 0429 05/23/23 0408  HGB 13.5 13.1 12.8  HCT 38.5 38.6 37.1   BMET Recent Labs    05/22/23 0429 05/23/23 0408  NA 138 141  K 3.9 3.9  CL 105 107  CO2 23 24  GLUCOSE 160* 123*  BUN 5* 9  CREATININE 1.05* 0.89  CALCIUM 8.8* 9.3     Studies/Results: DG C-Arm 1-60 Min-No Report Result Date: 05/22/2023 Fluoroscopy was utilized by the requesting physician.  No radiographic interpretation.   CT Renal Stone Study Result Date: 05/21/2023 CLINICAL DATA:  Abdominal/flank pain.  Concern for kidney stone. EXAM: CT ABDOMEN AND PELVIS WITHOUT CONTRAST TECHNIQUE: Multidetector CT imaging of the abdomen and pelvis was performed following the standard protocol without  IV contrast. RADIATION DOSE REDUCTION: This exam was performed according to the departmental dose-optimization program which includes automated exposure control, adjustment of the mA and/or kV according to patient size and/or use of iterative reconstruction technique. COMPARISON:  None Available. FINDINGS: Evaluation of this exam is limited in the absence of intravenous contrast. Lower chest: The visualized lung bases are clear. No intra-abdominal free air or free fluid. Hepatobiliary: Mild fatty liver. No biliary dilatation. Cholecystectomy. No retained calcified stone noted in the central CBD. Pancreas: Unremarkable. No pancreatic ductal dilatation or surrounding inflammatory changes. Spleen: Normal in size without focal abnormality. Adrenals/Urinary Tract: The adrenal glands are unremarkable. There is a 2 mm stone in the proximal left ureter with mild left hydronephrosis. There is no hydronephrosis or nephrolithiasis on the right. Subcentimeter partially exophytic right renal interpolar hypodense lesion is not characterized on this CT, possibly a cyst. Ultrasound may provide better evaluation on a nonemergent/outpatient basis. The right ureter appears unremarkable. The urinary bladder is minimally distended and grossly unremarkable. Stomach/Bowel: There is no bowel obstruction or active inflammation. The appendix is not visualized with certainty. No inflammatory changes identified in the right lower quadrant. Vascular/Lymphatic: Mild aortoiliac atherosclerotic disease. The IVC is unremarkable. No portal venous gas. There is no adenopathy. Reproductive: The uterus is anteverted. An intrauterine device is noted. No suspicious adnexal masses. Other: None Musculoskeletal: No acute or significant osseous findings. IMPRESSION: 1. A 2 mm proximal left ureteral stone with mild left hydronephrosis. 2. No bowel obstruction. 3. Mild fatty liver. Electronically Signed   By: Burtis Junes  Radparvar M.D.   On: 05/21/2023 20:39       LOS: 1 day   Elmon Kirschner, NP [facility] Pager: [phone]  05/23/2023, 1:35 PM

## 2023-05-23 NOTE — Progress Notes (Signed)
 PROGRESS NOTE                                                                                                                                                                                                             Patient Demographics:    Valerie Merritt, is a 53 y.o. female, DOB - 01-20-1971, ZOX:[id number]  Outpatient Primary MD for the patient is Erskine Emery, NP    LOS - 1  Admit date - 05/21/2023    Chief Complaint  Patient presents with   Fever       Brief Narrative (HPI from H&P)   53 y.o. female with medical history significant for obesity, initially seen at urgent care for concern for possible kidney stone.  UA was positive for pyuria and the patient was started on Macrobid.  The patient developed a fever with chills that started today.  Associated with nausea without vomiting.  The patient presented to [facility] ED for further evaluation.   In the ER, CT scan done and showed 2 mm obstructing ureteral stone.  The patient was started on IV antibiotics.  EDP discussed the case with urology recommended transfer to [facility] ER to ER for ureteral stent placement.  EDP requested admission.  Admitted by [facility], hospitalist service.  Accepted by Dr. Antionette Char and transferred to [facility] ER to ER.   The patient is POD #0, status post cystoscopy and left ureteral stent placement on 05/22/2023 by Dr. Shelby Dubin.  Postop the patient was transferred to progressive care unit as inpatient status.   Subjective:   Patient in bed, appears comfortable, denies any headache, no fever, no chest pain or pressure, no shortness of breath , no abdominal pain. No focal weakness.  Patient is having a lot of urinary incontinence since yesterday.   Assessment  & Plan :   Sepsis secondary to complicated UTI, secondary to left obstructing ureteral stone present on admission.  Mild AKI Has been seen by urology, s/p cystoscopy with left ureteral  stent placement on 05/22/2023 by Dr. Shelby Dubin.  On empiric antibiotics, follow cultures, continue IV fluids for hydration, advance activity and monitor.  Urinary incontinence.  Discussed with urology, she might have dislodged her stent.  They will address it soon.  History of depression.  Continue Lexapro.   Ongoing smoking.  Counseled to quit.  On NicoDerm patch.       Condition - Fair  Family Communication  :  None present  Code Status :  Full  Consults  :  Urology  PUD Prophylaxis :    Procedures  :     CT - 1. A 2 mm proximal left ureteral stone with mild left hydronephrosis. 2. No bowel obstruction. 3. Mild fatty liver.      Disposition Plan  :    Status is: Inpatient  DVT Prophylaxis  :    SCDs Start: 05/22/23 0237    Lab Results  Component Value Date   PLT 264 05/23/2023    Diet :  Diet Order             Diet regular Room service appropriate? Yes; Fluid consistency: Thin  Diet effective now                    Inpatient Medications  Scheduled Meds:  acetaminophen  1,000 mg Oral Q8H   amLODipine  10 mg Oral Daily   escitalopram  10 mg Oral Daily   folic acid  1 mg Oral Daily   nicotine  21 mg Transdermal Daily   tamsulosin  0.4 mg Oral Daily   Continuous Infusions:  lactated ringers 75 mL/hr at 05/22/23 1550   PRN Meds:.hyoscyamine, morphine injection, oxyCODONE, polyethylene glycol, prochlorperazine, traZODone    Objective:   Vitals:   05/22/23 2331 05/23/23 0325 05/23/23 0630 05/23/23 0700  BP: 127/79 (!) 150/102 137/74 128/87  Pulse: (!) 50 61  (!) 54  Resp: 17 17    Temp: 97.6 F (36.4 C) 97.6 F (36.4 C)  97.8 F (36.6 C)  TempSrc: Oral Oral  Oral  SpO2: 97% 97%  99%  Weight:      Height:        Wt Readings from Last 3 Encounters:  05/21/23 83.9 kg  08/29/22 84.8 kg  08/12/19 88.5 kg     Intake/Output Summary (Last 24 hours) at 05/23/2023 0908 Last data filed at 05/23/2023 0316 Gross per 24 hour  Intake --  Output  1750 ml  Net -1750 ml     Physical Exam  Awake Alert, No new F.N deficits, Normal affect [place].AT,PERRAL Supple Neck, No JVD,   Symmetrical Chest wall movement, Good air movement bilaterally, CTAB RRR,No Gallops,Rubs or new Murmurs,  +ve B.Sounds, Abd Soft, No tenderness,   No Cyanosis, Clubbing or edema      Data Review:    Recent Labs  Lab 05/21/23 2016 05/22/23 0429 05/23/23 0408  WBC 15.5* 15.7* 15.3*  HGB 13.5 13.1 12.8  HCT 38.5 38.6 37.1  PLT 255 256 264  MCV 96.3 99.0 97.1  MCH 33.8 33.6 33.5  MCHC 35.1 33.9 34.5  RDW 12.0 12.1 12.0  LYMPHSABS 2.1  --  3.6  MONOABS 1.1*  --  0.8  EOSABS 0.0  --  0.0  BASOSABS 0.0  --  0.0    Recent Labs  Lab 05/21/23 2016 05/22/23 0429 05/22/23 0431 05/23/23 0408  NA 137 138  --  141  K 3.5 3.9  --  3.9  CL 106 105  --  107  CO2 22 23  --  24  ANIONGAP 9 10  --  10  GLUCOSE 109* 160*  --  123*  BUN 7 5*  --  9  CREATININE 0.89 1.05*  --  0.89  AST 15  --   --   --   ALT  20  --   --   --   ALKPHOS 81  --   --   --   BILITOT 0.9  --   --   --   ALBUMIN 3.6  --   --   --   PROCALCITON  --   --   --  0.11  LATICACIDVEN 1.0  --  1.0  --   MG  --  1.9  --  2.0  PHOS  --  3.0  --   --   CALCIUM 8.7* 8.8*  --  9.3      Recent Labs  Lab 05/21/23 2016 05/22/23 0429 05/22/23 0431 05/23/23 0408  PROCALCITON  --   --   --  0.11  LATICACIDVEN 1.0  --  1.0  --   MG  --  1.9  --  2.0  CALCIUM 8.7* 8.8*  --  9.3    --------------------------------------------------------------------------------------------------------------- Lab Results  Component Value Date   CHOL 186 12/26/2013   HDL 38 (L) 12/26/2013   LDLCALC 121 (H) 12/26/2013   TRIG 135 12/26/2013   CHOLHDL 4.9 12/26/2013    Micro Results Recent Results (from the past 240 hours)  Culture, blood (routine x 2)     Status: None (Preliminary result)   Collection Time: 05/21/23  8:10 PM   Specimen: BLOOD RIGHT WRIST  Result Value Ref Range Status    Specimen Description   Final    BLOOD RIGHT WRIST Performed at [facility], [address] [place], [place] [address]    Special Requests   Final    BOTTLES DRAWN AEROBIC AND ANAEROBIC Blood Culture adequate volume Performed at [facility], [address] [place], [place] [address]    Culture   Final    NO GROWTH 2 DAYS Performed at [facility], [address] [place], [place] [address]    Report Status PENDING  Incomplete  Culture, blood (routine x 2)     Status: None (Preliminary result)   Collection Time: 05/21/23  8:15 PM   Specimen: BLOOD LEFT FOREARM  Result Value Ref Range Status   Specimen Description   Final    BLOOD LEFT FOREARM Performed at [facility], [address] [place], [place] [address]    Special Requests   Final    BOTTLES DRAWN AEROBIC AND ANAEROBIC Blood Culture results may not be optimal due to an inadequate volume of blood received in culture bottles Performed at [facility], [address] [place], [place] [address]    Culture   Final    NO GROWTH 2 DAYS Performed at [facility], [address], [place], [place] [address]    Report Status PENDING  Incomplete  Resp panel by RT-PCR (RSV, Flu A&B, Covid) Anterior Nasal Swab     Status: None   Collection Time: 05/21/23  9:37 PM   Specimen: Anterior Nasal Swab  Result Value Ref Range Status   SARS Coronavirus 2 by RT PCR NEGATIVE NEGATIVE Final    Comment: (NOTE) SARS-CoV-2 target nucleic acids are NOT DETECTED.  The SARS-CoV-2 RNA is generally detectable in upper respiratory specimens during the acute phase of infection. The lowest concentration of SARS-CoV-2 viral copies this assay can detect is 138 copies/mL. A negative result does not preclude SARS-Cov-2 infection and should not be used as the sole basis for treatment or other patient management decisions. A negative result may occur  with  improper specimen collection/handling,  submission of specimen other than nasopharyngeal swab, presence of viral mutation(s) within the areas targeted by this assay, and inadequate number of viral copies(<138 copies/mL). A negative result must be combined with clinical observations, patient history, and epidemiological information. The expected result is Negative.  Fact Sheet for Patients:  BloggerCourse.com  Fact Sheet for Healthcare Providers:  SeriousBroker.it  This test is no t yet approved or cleared by the [place] FDA and  has been authorized for detection and/or diagnosis of SARS-CoV-2 by FDA under an Emergency Use Authorization (EUA). This EUA will remain  in effect (meaning this test can be used) for the duration of the COVID-19 declaration under Section 564(b)(1) of the Act, 21 U.S.C.section 360bbb-3(b)(1), unless the authorization is terminated  or revoked sooner.       Influenza A by PCR NEGATIVE NEGATIVE Final   Influenza B by PCR NEGATIVE NEGATIVE Final    Comment: (NOTE) The Xpert Xpress SARS-CoV-2/FLU/RSV plus assay is intended as an aid in the diagnosis of influenza from Nasopharyngeal swab specimens and should not be used as a sole basis for treatment. Nasal washings and aspirates are unacceptable for Xpert Xpress SARS-CoV-2/FLU/RSV testing.  Fact Sheet for Patients: BloggerCourse.com  Fact Sheet for Healthcare Providers: SeriousBroker.it  This test is not yet approved or cleared by the [place] FDA and has been authorized for detection and/or diagnosis of SARS-CoV-2 by FDA under an Emergency Use Authorization (EUA). This EUA will remain in effect (meaning this test can be used) for the duration of the COVID-19 declaration under Section 564(b)(1) of the Act, 21 U.S.C. section 360bbb-3(b)(1), unless the authorization is terminated or revoked.     Resp Syncytial Virus by PCR NEGATIVE  NEGATIVE Final    Comment: (NOTE) Fact Sheet for Patients: BloggerCourse.com  Fact Sheet for Healthcare Providers: SeriousBroker.it  This test is not yet approved or cleared by the [place] FDA and has been authorized for detection and/or diagnosis of SARS-CoV-2 by FDA under an Emergency Use Authorization (EUA). This EUA will remain in effect (meaning this test can be used) for the duration of the COVID-19 declaration under Section 564(b)(1) of the Act, 21 U.S.C. section 360bbb-3(b)(1), unless the authorization is terminated or revoked.  Performed at [facility], [address] [place], [place] [address]   Aerobic/Anaerobic Culture w Gram Stain (surgical/deep wound)     Status: None (Preliminary result)   Collection Time: 05/22/23 12:37 AM   Specimen: Path fluid; Body Fluid  Result Value Ref Range Status   Specimen Description FLUID  Final   Special Requests NONE  Final   Gram Stain NO WBC SEEN NO ORGANISMS SEEN   Final   Culture   Final    NO GROWTH 1 DAY Performed at [facility], [address] [place], [place] [address]    Report Status PENDING  Incomplete    Radiology Report DG C-Arm 1-60 Min-No Report Result Date: 05/22/2023 Fluoroscopy was utilized by the requesting physician.  No radiographic interpretation.   CT Renal Stone Study Result Date: 05/21/2023 CLINICAL DATA:  Abdominal/flank pain.  Concern for kidney stone. EXAM: CT ABDOMEN AND PELVIS WITHOUT CONTRAST TECHNIQUE: Multidetector CT imaging of the abdomen and pelvis was performed following the standard protocol without IV contrast. RADIATION DOSE REDUCTION: This exam was performed according to the departmental dose-optimization program which includes automated exposure control, adjustment of the mA and/or kV according to patient size and/or use of iterative reconstruction technique.  COMPARISON:  None Available. FINDINGS: Evaluation of this  exam is limited in the absence of intravenous contrast. Lower chest: The visualized lung bases are clear. No intra-abdominal free air or free fluid. Hepatobiliary: Mild fatty liver. No biliary dilatation. Cholecystectomy. No retained calcified stone noted in the central CBD. Pancreas: Unremarkable. No pancreatic ductal dilatation or surrounding inflammatory changes. Spleen: Normal in size without focal abnormality. Adrenals/Urinary Tract: The adrenal glands are unremarkable. There is a 2 mm stone in the proximal left ureter with mild left hydronephrosis. There is no hydronephrosis or nephrolithiasis on the right. Subcentimeter partially exophytic right renal interpolar hypodense lesion is not characterized on this CT, possibly a cyst. Ultrasound may provide better evaluation on a nonemergent/outpatient basis. The right ureter appears unremarkable. The urinary bladder is minimally distended and grossly unremarkable. Stomach/Bowel: There is no bowel obstruction or active inflammation. The appendix is not visualized with certainty. No inflammatory changes identified in the right lower quadrant. Vascular/Lymphatic: Mild aortoiliac atherosclerotic disease. The IVC is unremarkable. No portal venous gas. There is no adenopathy. Reproductive: The uterus is anteverted. An intrauterine device is noted. No suspicious adnexal masses. Other: None Musculoskeletal: No acute or significant osseous findings. IMPRESSION: 1. A 2 mm proximal left ureteral stone with mild left hydronephrosis. 2. No bowel obstruction. 3. Mild fatty liver. Electronically Signed   By: Elgie Collard M.D.   On: 05/21/2023 20:39     Signature  -   Susa Raring M.D on 05/23/2023 at 9:08 AM   -  To page go to [email or web address]

## 2023-05-23 NOTE — Progress Notes (Signed)
 Patient refused schedule med (Heparin S/Q). Made MD Opyd aware.

## 2023-05-23 NOTE — Progress Notes (Signed)
   05/23/23 1538  TOC Brief Assessment  Insurance and Status Reviewed (generic commercial)  Patient has primary care physician Yes Irving Burton, Orie Rout, NP)  Home environment has been reviewed from home  Prior level of function: independent  Prior/Current Home Services No current home services  Social Drivers of Health Review SDOH reviewed interventions complete (smoking cessation information attached)  Readmission risk has been reviewed Yes  Transition of care needs no transition of care needs at this time   [facility] will follow for any DC  needs

## 2023-05-24 ENCOUNTER — Other Ambulatory Visit (HOSPITAL_COMMUNITY): Payer: Self-pay

## 2023-05-24 DIAGNOSIS — N390 Urinary tract infection, site not specified: Secondary | ICD-10-CM | POA: Diagnosis not present

## 2023-05-24 DIAGNOSIS — A419 Sepsis, unspecified organism: Secondary | ICD-10-CM | POA: Diagnosis not present

## 2023-05-24 LAB — BASIC METABOLIC PANEL WITH GFR
Anion gap: 8 (ref 5–15)
BUN: 10 mg/dL (ref 6–20)
CO2: 26 mmol/L (ref 22–32)
Calcium: 8.7 mg/dL — ABNORMAL LOW (ref 8.9–10.3)
Chloride: 108 mmol/L (ref 98–111)
Creatinine, Ser: 0.91 mg/dL (ref 0.44–1.00)
GFR, Estimated: >60 mL/min (ref >60)
Glucose, Bld: 100 mg/dL — ABNORMAL HIGH (ref 70–99)
Potassium: 3.8 mmol/L (ref 3.5–5.1)
Sodium: 142 mmol/L (ref 135–145)

## 2023-05-24 LAB — CBC WITH DIFFERENTIAL/PLATELET
Abs Immature Granulocytes: 0.04 10*3/uL (ref 0.00–0.07)
Basophils Absolute: 0 10*3/uL (ref 0.0–0.1)
Basophils Relative: 0 %
Eosinophils Absolute: 0.1 10*3/uL (ref 0.0–0.5)
Eosinophils Relative: 1 %
HCT: 35.9 % — ABNORMAL LOW (ref 36.0–46.0)
Hemoglobin: 12.1 g/dL (ref 12.0–15.0)
Immature Granulocytes: 0 %
Lymphocytes Relative: 43 %
Lymphs Abs: 4 10*3/uL (ref 0.7–4.0)
MCH: 33.1 pg (ref 26.0–34.0)
MCHC: 33.7 g/dL (ref 30.0–36.0)
MCV: 98.1 fL (ref 80.0–100.0)
Monocytes Absolute: 0.6 10*3/uL (ref 0.1–1.0)
Monocytes Relative: 6 %
Neutro Abs: 4.6 10*3/uL (ref 1.7–7.7)
Neutrophils Relative %: 50 %
Platelets: 272 10*3/uL (ref 150–400)
RBC: 3.66 MIL/uL — ABNORMAL LOW (ref 3.87–5.11)
RDW: 12.2 % (ref 11.5–15.5)
WBC: 9.3 10*3/uL (ref 4.0–10.5)
nRBC: 0 % (ref 0.0–0.2)

## 2023-05-24 LAB — PROCALCITONIN: Procalcitonin: <0.1 ng/mL

## 2023-05-24 LAB — MAGNESIUM: Magnesium: 1.9 mg/dL (ref 1.7–2.4)

## 2023-05-24 MED ORDER — CEFDINIR 300 MG PO CAPS
600.0000 mg | ORAL_CAPSULE | Freq: Two times a day (BID) | ORAL | 0 refills | Status: DC
  Filled 2023-05-24: qty 20, 5d supply, fill #0

## 2023-05-24 MED ORDER — CEFTRIAXONE SODIUM 2 G IJ SOLR
2.0000 g | Freq: Once | INTRAMUSCULAR | Status: AC
  Administered 2023-05-24: 2 g via INTRAVENOUS
  Filled 2023-05-24: qty 20

## 2023-05-24 MED ORDER — TAMSULOSIN HCL 0.4 MG PO CAPS
0.4000 mg | ORAL_CAPSULE | Freq: Every day | ORAL | 0 refills | Status: DC
  Filled 2023-05-24: qty 30, 30d supply, fill #0

## 2023-05-24 MED ORDER — HYOSCYAMINE SULFATE 0.125 MG SL SUBL
0.2500 mg | SUBLINGUAL_TABLET | Freq: Four times a day (QID) | SUBLINGUAL | 0 refills | Status: DC | PRN
  Filled 2023-05-24: qty 30, 4d supply, fill #0

## 2023-05-24 MED ORDER — AMLODIPINE BESYLATE 10 MG PO TABS
10.0000 mg | ORAL_TABLET | Freq: Every day | ORAL | 0 refills | Status: AC
  Filled 2023-05-24: qty 30, 30d supply, fill #0

## 2023-05-24 MED ORDER — CEFDINIR 300 MG PO CAPS
600.0000 mg | ORAL_CAPSULE | Freq: Every day | ORAL | 0 refills | Status: DC
  Filled 2023-05-24: qty 20, 10d supply, fill #0

## 2023-05-24 MED ORDER — ACETAMINOPHEN 500 MG PO TABS
500.0000 mg | ORAL_TABLET | Freq: Three times a day (TID) | ORAL | 0 refills | Status: AC | PRN
  Filled 2023-05-24: qty 20, 7d supply, fill #0

## 2023-05-24 NOTE — Discharge Instructions (Signed)
 Follow with Primary MD Erskine Emery, NP in 7 days   Get CBC, CMP, 2 view Chest X ray -  checked next visit with your primary MD    Activity: As tolerated with Full fall precautions use walker/cane & assistance as needed  Disposition Home    Diet: Heart Healthy    Special Instructions: If you have smoked or chewed Tobacco  in the last 2 yrs please stop smoking, stop any regular Alcohol  and or any Recreational drug use.  On your next visit with your primary care physician please Get Medicines reviewed and adjusted.  Please request your Prim.MD to go over all Hospital Tests and Procedure/Radiological results at the follow up, please get all Hospital records sent to your Prim MD by signing hospital release before you go home.  If you experience worsening of your admission symptoms, develop shortness of breath, life threatening emergency, suicidal or homicidal thoughts you must seek medical attention immediately by calling 911 or calling your MD immediately  if symptoms less severe.  You Must read complete instructions/literature along with all the possible adverse reactions/side effects for all the Medicines you take and that have been prescribed to you. Take any new Medicines after you have completely understood and accpet all the possible adverse reactions/side effects.   Do not drive when taking Pain medications.  Do not take more than prescribed Pain, Sleep and Anxiety Medications  Wear Seat belts while driving.

## 2023-05-24 NOTE — Progress Notes (Signed)
   3 Days Post-Op Subjective: Pt in good spirits. No spasms. Eager to discharge home  Objective: Vital signs in last 24 hours: Temp:  [97.7 F (36.5 C)-98.8 F (37.1 C)] 98.8 F (37.1 C) (04/09 0329) Pulse Rate:  [60-90] 70 (04/09 0329) Resp:  [16-21] 18 (04/09 0400) BP: (95-143)/(69-85) 125/74 (04/09 0329) SpO2:  [97 %-99 %] 98 % (04/09 0329)  Assessment/Plan: # Ureteral stone To the OR for cystoscopy with left ureteral stent placement- Dr.Hall  Definitive stone management on an outpatient basis once infection is cleared.  BC/UC NGTD, however patient received ABX prior to presenting to the hospital.  She has requested that her sensitivities be faxed over to the unit.  For reasons unclear, she was prescribed Azithromycin by the urgent care while in hospital. She has been instructed not to take this if this was for UTI. Omnicef for 10 days of total treatment on discharge.  # Bladder spasm Refractory to Ditropan.  Changed to hyoscyamine- total resolution of spasm. KUB 4/8. Stent is in place but distal curl is resting at bladder neck, likely contributing to spasm.   #urethra burning with urination Ok to use OTC Azo as needed for a few days.  OK to discharge from a Urologic perspective  Intake/Output from previous day: 04/08 0701 - 04/09 0700 In: 480 [P.O.:480] Out: -   Intake/Output this shift: No intake/output data recorded.  Physical Exam:  General: Alert and oriented CV: No cyanosis Lungs: equal chest rise Abdomen: Soft, NTND, no rebound or guarding   Lab Results: Recent Labs    05/22/23 0429 05/23/23 0408 05/24/23 0418  HGB 13.1 12.8 12.1  HCT 38.6 37.1 35.9*   BMET Recent Labs    05/23/23 0408 05/24/23 0418  NA 141 142  K 3.9 3.8  CL 107 108  CO2 24 26  GLUCOSE 123* 100*  BUN 9 10  CREATININE 0.89 0.91  CALCIUM 9.3 8.7*     Studies/Results: DG Abd 1 View Result Date: 05/23/2023 CLINICAL DATA:  Status post ureteral stent placement EXAM:  ABDOMEN - 1 VIEW COMPARISON:  CT abdomen and pelvis dated 05/21/2023 FINDINGS: Ureteral stent catheter projects over the expected location of the left ureter. Radiopaque intrauterine device projects over the midline lower pelvis. Multiple punctate radiodensities throughout the abdomen, likely ingested intraluminal material. Rounded calcifications projecting over the pelvis, likely phleboliths. Right upper quadrant surgical clips. Nonobstructive bowel gas pattern. IMPRESSION: Ureteral stent catheter projects over the expected location of the left ureter. Electronically Signed   By: Agustin Cree M.D.   On: 05/23/2023 15:22      LOS: 2 days   Elmon Kirschner, NP [facility] Pager: [phone]  05/24/2023, 8:53 AM

## 2023-05-24 NOTE — Discharge Summary (Signed)
 Valerie Merritt VZD:[id number] DOB: [birth year] DOA: 05/21/2023  PCP: Erskine Emery, NP  Admit date: 05/21/2023  Discharge date: 05/24/2023  Admitted From: Home   Disposition:  Home   Recommendations for Outpatient Follow-up:   Follow up with PCP in 1-2 weeks  PCP Please obtain BMP/CBC, 2 view CXR in 1week,  (see Discharge instructions)   PCP Please follow up on the following pending results:    Home Health: None   Equipment/Devices: None  Consultations: Urology Discharge Condition: Stable     CODE STATUS: Full     Diet Recommendation: Heart Healthy      Chief Complaint  Patient presents with   Fever     Brief history of present illness from the day of admission and additional interim summary    53 y.o. female with medical history significant for obesity, initially seen at urgent care for concern for possible kidney stone.  UA was positive for pyuria and the patient was started on Macrobid.  The patient developed a fever with chills that started today.  Associated with nausea without vomiting.  The patient presented to [facility] ED for further evaluation.   In the ER, CT scan done and showed 2 mm obstructing ureteral stone.  The patient was started on IV antibiotics.  EDP discussed the case with urology recommended transfer to [facility] ER to ER for ureteral stent placement.  EDP requested admission.  Admitted by [facility], hospitalist service.  Accepted by Dr. Antionette Char and transferred to [facility] ER to ER.   The patient is POD #0, status post cystoscopy and left ureteral stent placement on 05/22/2023 by Dr. Shelby Dubin.  Postop the patient was transferred to progressive care unit as inpatient status.                                                                 Hospital Course   Sepsis secondary to complicated  UTI, secondary to left obstructing ureteral stone present on admission.  Mild AKI Has been seen by urology, s/p cystoscopy with left ureteral stent placement on 05/22/2023 by Dr. Shelby Dubin.  On empiric antibiotics, will chest thus far negative has responded well to Rocephin and will be placed on oral Omnicef for 10 more days as recommended by urology with outpatient urology follow-up.  Return free this morning eager to go home.   Urinary incontinence.  Discussed with urology, placed on Levsin with good results continue as needed upon discharge along with Flomax.  Outpatient follow-up with urology within a week of discharge.   History of depression.  Continue Lexapro.   Ongoing smoking.  Counseled to quit.     Discharge diagnosis     Principal Problem:   Sepsis secondary to UTI [facility])    Discharge instructions    Discharge Instructions  Ambulatory Referral for Lung Cancer Scre   Complete by: As directed    Diet - low sodium heart healthy   Complete by: As directed    Discharge instructions   Complete by: As directed    Follow with Primary MD Erskine Emery, NP in 7 days   Get CBC, CMP, 2 view Chest X ray -  checked next visit with your primary MD    Activity: As tolerated with Full fall precautions use walker/cane & assistance as needed  Disposition Home    Diet: Heart Healthy    Special Instructions: If you have smoked or chewed Tobacco  in the last 2 yrs please stop smoking, stop any regular Alcohol  and or any Recreational drug use.  On your next visit with your primary care physician please Get Medicines reviewed and adjusted.  Please request your Prim.MD to go over all Hospital Tests and Procedure/Radiological results at the follow up, please get all Hospital records sent to your Prim MD by signing hospital release before you go home.  If you experience worsening of your admission symptoms, develop shortness of breath, life threatening emergency, suicidal or homicidal  thoughts you must seek medical attention immediately by calling 911 or calling your MD immediately  if symptoms less severe.  You Must read complete instructions/literature along with all the possible adverse reactions/side effects for all the Medicines you take and that have been prescribed to you. Take any new Medicines after you have completely understood and accpet all the possible adverse reactions/side effects.   Do not drive when taking Pain medications.  Do not take more than prescribed Pain, Sleep and Anxiety Medications  Wear Seat belts while driving.   Increase activity slowly   Complete by: As directed        Discharge Medications   Allergies as of 05/24/2023       Reactions   Dulera [mometasone Furo-formoterol Fum] Shortness Of Breath, Palpitations   SOB and tachycardia   Ivp Dye [iodinated Contrast Media] Anaphylaxis   Penicillins Rash        Medication List     STOP taking these medications    nitrofurantoin 100 MG capsule Commonly known as: MACRODANTIN       TAKE these medications    acetaminophen 500 MG tablet Commonly known as: TYLENOL Take 1 tablet (500 mg total) by mouth every 8 (eight) hours as needed.   amLODipine 10 MG tablet Commonly known as: NORVASC Take 1 tablet (10 mg total) by mouth daily. Start taking on: May 25, 2023   cefdinir 300 MG capsule Commonly known as: OMNICEF Take 2 capsules (600 mg total) by mouth 2 (two) times daily.   Cholecalciferol 1.25 MG (50000 UT) capsule Take 50,000 Units by mouth daily.   cyanocobalamin 1000 MCG tablet Commonly known as: VITAMIN B12 Take 1 tablet by mouth daily.   cyanocobalamin 1000 MCG/ML injection Commonly known as: VITAMIN B12 Inject 1,000 mcg into the skin every 30 (thirty) days.   escitalopram 20 MG tablet Commonly known as: LEXAPRO Take 20 mg by mouth daily.   folic acid 1 MG tablet Commonly known as: FOLVITE Take 1 mg by mouth daily.   hyoscyamine 0.125 MG SL  tablet Commonly known as: LEVSIN SL Place 2 tablets (0.25 mg total) under the tongue every 6 (six) hours as needed.   levonorgestrel 20 MCG/DAY Iud Commonly known as: MIRENA 1 each by Intrauterine route once.   tamsulosin 0.4 MG Caps capsule Commonly known as: FLOMAX  Take 1 capsule (0.4 mg total) by mouth daily. Start taking on: May 25, 2023   traZODone 50 MG tablet Commonly known as: DESYREL Take 25 mg by mouth at bedtime as needed for sleep.         Follow-up Information     Erskine Emery, NP. Schedule an appointment as soon as possible for a visit in 1 week(s).   Contact information: [address] [place] [address] [phone]         Joline Maxcy, MD. Schedule an appointment as soon as possible for a visit in 3 day(s).   Specialty: Urology Contact information: [address] [place] [address] [phone]                 Major procedures and Radiology Reports - PLEASE review detailed and final reports thoroughly  -      DG Abd 1 View Result Date: 05/23/2023 CLINICAL DATA:  Status post ureteral stent placement EXAM: ABDOMEN - 1 VIEW COMPARISON:  CT abdomen and pelvis dated 05/21/2023 FINDINGS: Ureteral stent catheter projects over the expected location of the left ureter. Radiopaque intrauterine device projects over the midline lower pelvis. Multiple punctate radiodensities throughout the abdomen, likely ingested intraluminal material. Rounded calcifications projecting over the pelvis, likely phleboliths. Right upper quadrant surgical clips. Nonobstructive bowel gas pattern. IMPRESSION: Ureteral stent catheter projects over the expected location of the left ureter. Electronically Signed   By: Agustin Cree M.D.   On: 05/23/2023 15:22   DG C-Arm 1-60 Min-No Report Result Date: 05/22/2023 Fluoroscopy was utilized by the requesting physician.  No radiographic interpretation.   CT Renal Stone Study Result Date:  05/21/2023 CLINICAL DATA:  Abdominal/flank pain.  Concern for kidney stone. EXAM: CT ABDOMEN AND PELVIS WITHOUT CONTRAST TECHNIQUE: Multidetector CT imaging of the abdomen and pelvis was performed following the standard protocol without IV contrast. RADIATION DOSE REDUCTION: This exam was performed according to the departmental dose-optimization program which includes automated exposure control, adjustment of the mA and/or kV according to patient size and/or use of iterative reconstruction technique. COMPARISON:  None Available. FINDINGS: Evaluation of this exam is limited in the absence of intravenous contrast. Lower chest: The visualized lung bases are clear. No intra-abdominal free air or free fluid. Hepatobiliary: Mild fatty liver. No biliary dilatation. Cholecystectomy. No retained calcified stone noted in the central CBD. Pancreas: Unremarkable. No pancreatic ductal dilatation or surrounding inflammatory changes. Spleen: Normal in size without focal abnormality. Adrenals/Urinary Tract: The adrenal glands are unremarkable. There is a 2 mm stone in the proximal left ureter with mild left hydronephrosis. There is no hydronephrosis or nephrolithiasis on the right. Subcentimeter partially exophytic right renal interpolar hypodense lesion is not characterized on this CT, possibly a cyst. Ultrasound may provide better evaluation on a nonemergent/outpatient basis. The right ureter appears unremarkable. The urinary bladder is minimally distended and grossly unremarkable. Stomach/Bowel: There is no bowel obstruction or active inflammation. The appendix is not visualized with certainty. No inflammatory changes identified in the right lower quadrant. Vascular/Lymphatic: Mild aortoiliac atherosclerotic disease. The IVC is unremarkable. No portal venous gas. There is no adenopathy. Reproductive: The uterus is anteverted. An intrauterine device is noted. No suspicious adnexal masses. Other: None Musculoskeletal: No acute or  significant osseous findings. IMPRESSION: 1. A 2 mm proximal left ureteral stone with mild left hydronephrosis. 2. No bowel obstruction. 3. Mild fatty liver. Electronically Signed   By: Elgie Collard M.D.   On: 05/21/2023 20:39  Micro Results    Recent Results (from the past 240 hours)  Culture, blood (routine x 2)     Status: None (Preliminary result)   Collection Time: 05/21/23  8:10 PM   Specimen: BLOOD RIGHT WRIST  Result Value Ref Range Status   Specimen Description   Final    BLOOD RIGHT WRIST Performed at [facility], [address] [place], [place] [address]    Special Requests   Final    BOTTLES DRAWN AEROBIC AND ANAEROBIC Blood Culture adequate volume Performed at [facility], [address] [place], [place] [address]    Culture   Final    NO GROWTH 3 DAYS Performed at [facility], [address], [place], [place] [address]    Report Status PENDING  Incomplete  Culture, blood (routine x 2)     Status: None (Preliminary result)   Collection Time: 05/21/23  8:15 PM   Specimen: BLOOD LEFT FOREARM  Result Value Ref Range Status   Specimen Description   Final    BLOOD LEFT FOREARM Performed at [facility], [address] [place], [place] [address]    Special Requests   Final    BOTTLES DRAWN AEROBIC AND ANAEROBIC Blood Culture results may not be optimal due to an inadequate volume of blood received in culture bottles Performed at [facility], [address] [place], [place] [address]    Culture   Final    NO GROWTH 3 DAYS Performed at [facility], [address], [place], [place] [address]    Report Status PENDING  Incomplete  Resp panel by RT-PCR (RSV, Flu A&B, Covid) Anterior Nasal Swab     Status: None   Collection Time: 05/21/23  9:37 PM   Specimen: Anterior Nasal Swab  Result Value Ref Range Status   SARS Coronavirus 2 by RT PCR NEGATIVE NEGATIVE Final    Comment: (NOTE) SARS-CoV-2 target  nucleic acids are NOT DETECTED.  The SARS-CoV-2 RNA is generally detectable in upper respiratory specimens during the acute phase of infection. The lowest concentration of SARS-CoV-2 viral copies this assay can detect is 138 copies/mL. A negative result does not preclude SARS-Cov-2 infection and should not be used as the sole basis for treatment or other patient management decisions. A negative result may occur with  improper specimen collection/handling, submission of specimen other than nasopharyngeal swab, presence of viral mutation(s) within the areas targeted by this assay, and inadequate number of viral copies(<138 copies/mL). A negative result must be combined with clinical observations, patient history, and epidemiological information. The expected result is Negative.  Fact Sheet for Patients:  BloggerCourse.com  Fact Sheet for Healthcare Providers:  SeriousBroker.it  This test is no t yet approved or cleared by the [place] FDA and  has been authorized for detection and/or diagnosis of SARS-CoV-2 by FDA under an Emergency Use Authorization (EUA). This EUA will remain  in effect (meaning this test can be used) for the duration of the COVID-19 declaration under Section 564(b)(1) of the Act, 21 U.S.C.section 360bbb-3(b)(1), unless the authorization is terminated  or revoked sooner.       Influenza A by PCR NEGATIVE NEGATIVE Final   Influenza B by PCR NEGATIVE NEGATIVE Final    Comment: (NOTE) The Xpert Xpress SARS-CoV-2/FLU/RSV plus assay is intended as an aid in the diagnosis of influenza from Nasopharyngeal swab specimens and should not be used as a sole basis  for treatment. Nasal washings and aspirates are unacceptable for Xpert Xpress SARS-CoV-2/FLU/RSV testing.  Fact Sheet for Patients: BloggerCourse.com  Fact Sheet for Healthcare  Providers: SeriousBroker.it  This test is not yet approved or cleared by the [place] FDA and has been authorized for detection and/or diagnosis of SARS-CoV-2 by FDA under an Emergency Use Authorization (EUA). This EUA will remain in effect (meaning this test can be used) for the duration of the COVID-19 declaration under Section 564(b)(1) of the Act, 21 U.S.C. section 360bbb-3(b)(1), unless the authorization is terminated or revoked.     Resp Syncytial Virus by PCR NEGATIVE NEGATIVE Final    Comment: (NOTE) Fact Sheet for Patients: BloggerCourse.com  Fact Sheet for Healthcare Providers: SeriousBroker.it  This test is not yet approved or cleared by the [place] FDA and has been authorized for detection and/or diagnosis of SARS-CoV-2 by FDA under an Emergency Use Authorization (EUA). This EUA will remain in effect (meaning this test can be used) for the duration of the COVID-19 declaration under Section 564(b)(1) of the Act, 21 U.S.C. section 360bbb-3(b)(1), unless the authorization is terminated or revoked.  Performed at [facility], [address] [place], [place] [address]   Aerobic/Anaerobic Culture w Gram Stain (surgical/deep wound)     Status: None (Preliminary result)   Collection Time: 05/22/23 12:37 AM   Specimen: Path fluid; Body Fluid  Result Value Ref Range Status   Specimen Description FLUID  Final   Special Requests NONE  Final   Gram Stain NO WBC SEEN NO ORGANISMS SEEN   Final   Culture   Final    NO GROWTH 1 DAY Performed at [facility], [address], [place], [place] [address]    Report Status PENDING  Incomplete  Urine Culture (for pregnant, neutropenic or urologic patients or patients with an indwelling urinary catheter)     Status: None   Collection Time: 05/22/23  3:24 AM   Specimen: Urine, Clean Catch  Result Value Ref Range Status   Specimen  Description URINE, CLEAN CATCH  Final   Special Requests NONE  Final   Culture   Final    NO GROWTH Performed at [facility], [address], [place], [place] [address]    Report Status 05/23/2023 FINAL  Final    Today   Subjective    Rayette Mogg today has no headache,no chest abdominal pain,no new weakness tingling or numbness, feels much better wants to go home today.    Objective   Blood pressure 124/79, pulse 71, temperature 98.2 F (36.8 C), temperature source Oral, resp. rate 20, height 5\' 4"  (1.626 m), weight 83.9 kg, SpO2 99%.   Intake/Output Summary (Last 24 hours) at 05/24/2023 1051 Last data filed at 05/24/2023 3086 Gross per 24 hour  Intake 480 ml  Output --  Net 480 ml    Exam  Awake Alert, No new F.N deficits,    [place].AT,PERRAL Supple Neck,   Symmetrical Chest wall movement, Good air movement bilaterally, CTAB RRR,No Gallops,   +ve B.Sounds, Abd Soft, Non tender,  No Cyanosis, Clubbing or edema     Data Review   Recent Labs  Lab 05/21/23 2016 05/22/23 0429 05/23/23 0408 05/24/23 0418  WBC 15.5* 15.7* 15.3* 9.3  HGB 13.5 13.1 12.8 12.1  HCT 38.5 38.6 37.1 35.9*  PLT 255 256 264 272  MCV 96.3 99.0 97.1 98.1  MCH 33.8 33.6 33.5 33.1  MCHC 35.1 33.9 34.5 33.7  RDW  12.0 12.1 12.0 12.2  LYMPHSABS 2.1  --  3.6 4.0  MONOABS 1.1*  --  0.8 0.6  EOSABS 0.0  --  0.0 0.1  BASOSABS 0.0  --  0.0 0.0    Recent Labs  Lab 05/21/23 2016 05/22/23 0429 05/22/23 0431 05/23/23 0408 05/24/23 0418  NA 137 138  --  141 142  K 3.5 3.9  --  3.9 3.8  CL 106 105  --  107 108  CO2 22 23  --  24 26  ANIONGAP 9 10  --  10 8  GLUCOSE 109* 160*  --  123* 100*  BUN 7 5*  --  9 10  CREATININE 0.89 1.05*  --  0.89 0.91  AST 15  --   --   --   --   ALT 20  --   --   --   --   ALKPHOS 81  --   --   --   --   BILITOT 0.9  --   --   --   --   ALBUMIN 3.6  --   --   --   --   PROCALCITON  --   --   --  0.11 <0.10  LATICACIDVEN 1.0  --  1.0  --   --   MG   --  1.9  --  2.0 1.9  PHOS  --  3.0  --   --   --   CALCIUM 8.7* 8.8*  --  9.3 8.7*    Total Time in preparing paper work, data evaluation and todays exam - 35 minutes  Signature  -    Susa Raring M.D on 05/24/2023 at 10:51 AM   -  To page go to [email or web address]

## 2023-05-24 NOTE — TOC Transition Note (Signed)
 Transition of Care [facility]) - Discharge Note   Patient Details  Name: Valerie Merritt MRN: [id number] Date of Birth: Jul 09, 1970  Transition of [facility]) CM/SW Contact:  Gordy Clement, RN Phone Number: 05/24/2023, 10:55 AM   Clinical Narrative:     Patient will DC to home today No TOC needs have been identified. Patient will follow up as directed on AVS. Family will transport           Patient Goals and CMS Choice            Discharge Placement                       Discharge Plan and Services Additional resources added to the After Visit Summary for                                       Social Drivers of Health (SDOH) Interventions SDOH Screenings   Food Insecurity: No Food Insecurity (05/22/2023)  Housing: Low Risk  (05/22/2023)  Transportation Needs: No Transportation Needs (05/22/2023)  Utilities: Not At Risk (05/22/2023)  Tobacco Use: High Risk (05/21/2023)     Readmission Risk Interventions     No data to display

## 2023-05-24 NOTE — Plan of Care (Signed)
                                      [facility]                            [address] [place], [place] [address]      Domingue Coltrain was admitted to the Hospital on 05/21/2023 and Discharged  05/24/2023 and should be excused from work/school   for 6 days starting from date -  05/21/2023 , may return to work/school without any restrictions.  Call Susa Raring MD, [facility]  [phone] with questions.  Susa Raring M.D on 05/24/2023,at 10:50 AM  [facility]   Office  [phone]

## 2023-05-24 NOTE — Plan of Care (Signed)
 Pt has rested quietly throughout the night with no distress noted. Alert and oriented. On room air. SR on the monitor. Up ad lib in room, to BR and to kitchen area. Medicated with levsin and trazadone with relief noted. Only pain med was scheduled tylenol. No other complaints voiced.     Problem: Education: Goal: Knowledge of General Education information will improve Description: Including pain rating scale, medication(s)/side effects and non-pharmacologic comfort measures Outcome: Progressing   Problem: Health Behavior/Discharge Planning: Goal: Ability to manage health-related needs will improve Outcome: Progressing   Problem: Activity: Goal: Risk for activity intolerance will decrease Outcome: Progressing   Problem: Nutrition: Goal: Adequate nutrition will be maintained Outcome: Progressing   Problem: Elimination: Goal: Will not experience complications related to urinary retention Outcome: Progressing   Problem: Pain Managment: Goal: General experience of comfort will improve and/or be controlled Outcome: Progressing

## 2023-05-26 LAB — CULTURE, BLOOD (ROUTINE X 2)
Culture: NO GROWTH
Culture: NO GROWTH
Special Requests: ADEQUATE

## 2023-05-27 LAB — AEROBIC/ANAEROBIC CULTURE W GRAM STAIN (SURGICAL/DEEP WOUND)
Culture: NO GROWTH
Gram Stain: NONE SEEN

## 2023-05-30 ENCOUNTER — Ambulatory Visit (INDEPENDENT_AMBULATORY_CARE_PROVIDER_SITE_OTHER): Payer: PRIVATE HEALTH INSURANCE | Admitting: Urology

## 2023-05-30 ENCOUNTER — Encounter: Payer: Self-pay | Admitting: Urology

## 2023-05-30 VITALS — BP 107/80 | HR 71 | Ht 64.0 in | Wt 185.0 lb

## 2023-05-30 DIAGNOSIS — Z87440 Personal history of urinary (tract) infections: Secondary | ICD-10-CM

## 2023-05-30 DIAGNOSIS — N201 Calculus of ureter: Secondary | ICD-10-CM | POA: Diagnosis not present

## 2023-05-30 LAB — URINALYSIS, ROUTINE W REFLEX MICROSCOPIC
Bilirubin, UA: NEGATIVE
Glucose, UA: NEGATIVE
Ketones, UA: NEGATIVE
Nitrite, UA: NEGATIVE
Protein,UA: NEGATIVE
Specific Gravity, UA: <1.005 — ABNORMAL LOW (ref 1.005–1.030)
Urobilinogen, Ur: 0.2 mg/dL (ref 0.2–1.0)
pH, UA: 5.5 (ref 5.0–7.5)

## 2023-05-30 LAB — MICROSCOPIC EXAMINATION

## 2023-05-30 MED ORDER — PHENAZOPYRIDINE HCL 200 MG PO TABS: 200.0000 mg | ORAL_TABLET | Freq: Three times a day (TID) | ORAL | 0 refills | Status: AC | PRN

## 2023-05-30 NOTE — Progress Notes (Signed)
 Assessment: 1. Ureteral calculus. left, proximal, 2 mm   2. History of UTI     Plan: I personally reviewed the patient's chart including provider notes, lab and imaging results. I personally reviewed the CT study from 05/21/23 with results as noted below. Options for management of the left ureteral calculus discussed including stent removal with spontaneous passage, ureteroscopic stone manipulation with possible laser lithotripsy and stent exchange. Following our discussion, she would like to proceed with ureteroscopic stone manipulation, and stent exchange. Continue antibiotics. Request urine culture results from [facility] for review.  Procedure: The patient will be scheduled for cystoscopy, left retrograde pyelogram, left ureteroscopy, possible laser lithotripsy, exchange of left ureteral stent at [facility].  Surgical request is placed with the surgery schedulers and will be scheduled at the patient's/family request. Informed consent is given as documented below. Anesthesia: General  The patient does not have sleep apnea, history of MRSA, history of VRE, history of cardiac device requiring special anesthetic needs. Patient is stable and considered clear for surgical management in an outpatient ambulatory surgery setting as well as inpatient hospital setting.  Consent for Operation or Procedure: Provider Certification I hereby certify that the nature, purpose, benefits, usual and most frequent risks of, and alternatives to, the operation or procedure have been explained to the patient (or person authorized to sign for the patient) either by me as responsible physician or by the provider who is to perform the operation or procedure. Time spent such that the patient/family has had an opportunity to ask questions, and that those questions have been answered. The patient or the patient's representative has been advised that selected tasks may be performed by assistants to the  primary health care provider(s). I believe that the patient (or person authorized to sign for the patient) understands what has been explained, and has consented to the operation or procedure. No guarantees were implied or made.    Chief Complaint: Chief Complaint  Patient presents with   Nephrolithiasis    HPI: Valerie Merritt is a 53 y.o. female who presents for continued evaluation of a left ureteral calculus. She presented to the emergency room on 05/21/23 with a 2 mm proximal left obstructing stone with associated fever and chills.  She underwent cystoscopy with left ureteral stent insertion on 05/22/2023.  Urine culture showed no growth.  However, she had been on antibiotics for several days prior to her presentation.  She has done well since the procedure.  Her flank pain has resolved.  She is having stent related symptoms with frequency, and urgency.  No fevers or chills.  She continues on cefdinir for 2 more days.   Portions of the above documentation were copied from a prior visit for review purposes only.  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      PSH: Past Surgical History:  Procedure Laterality Date   APPENDECTOMY  1985   CHOLECYSTECTOMY N/A 02/14/2014  Procedure: LAPAROSCOPIC CHOLECYSTECTOMY;  Surgeon: Sim Dryer, MD;  Location: [facility] OR;  Service: General;  Laterality: N/A;   CYSTOSCOPY WITH STENT PLACEMENT Left 05/21/2023   Procedure: CYSTOSCOPY, WITH STENT INSERTION;  Surgeon: Scarlet Curly, MD;  Location: [facility] OR;  Service: Urology;  Laterality: Left;   INTRAUTERINE DEVICE (IUD) INSERTION  12/2011   URETHRAL DILATION      SH: Social History   Tobacco Use   Smoking status: Every Day    Current packs/day: 1.00    Average packs/day: 1 pack/day for 25.0 years (25.0 ttl pk-yrs)    Types: Cigarettes   Smokeless tobacco: Never   Tobacco comments:    She is down to 1/2 ppd;   Vaping Use   Vaping status: Never Used  Substance Use Topics   Alcohol use: No   Drug use: No    ROS: Constitutional:  Negative for fever, chills, weight loss CV: Negative for chest pain, previous MI, hypertension Respiratory:  Negative for shortness of breath, wheezing, sleep apnea, frequent cough GI:  Negative for nausea, vomiting, bloody stool, GERD  PE: BP 107/80   Pulse 71   Ht 5\' 4"  (1.626 m)   Wt 185 lb (83.9 kg)   BMI 31.76 kg/m  GENERAL APPEARANCE:  Well appearing, well developed, well nourished, NAD HEENT:  Atraumatic, normocephalic, oropharynx clear NECK:  Supple without lymphadenopathy or thyromegaly ABDOMEN:  Soft, non-tender, no masses EXTREMITIES:  Moves all extremities well, without clubbing, cyanosis, or edema NEUROLOGIC:  Alert and oriented x 3, normal gait, CN II-XII grossly intact MENTAL STATUS:  appropriate BACK:  Non-tender to palpation, No CVAT SKIN:  Warm, dry, and intact   Results: U/A: 0-5 WBC, 3-10 RBC, few bacteria

## 2023-06-01 ENCOUNTER — Ambulatory Visit: Payer: Self-pay | Admitting: Urology

## 2023-06-01 DIAGNOSIS — N201 Calculus of ureter: Secondary | ICD-10-CM

## 2023-06-06 ENCOUNTER — Telehealth: Payer: Self-pay | Admitting: Urology

## 2023-06-06 ENCOUNTER — Other Ambulatory Visit: Payer: Self-pay | Admitting: Urology

## 2023-06-06 DIAGNOSIS — N201 Calculus of ureter: Secondary | ICD-10-CM

## 2023-06-06 MED ORDER — CEPHALEXIN 500 MG PO CAPS: 500.0000 mg | ORAL_CAPSULE | Freq: Every day | ORAL | 0 refills | Status: AC

## 2023-06-06 MED ORDER — HYOSCYAMINE SULFATE 0.125 MG SL SUBL
0.2500 mg | SUBLINGUAL_TABLET | Freq: Four times a day (QID) | SUBLINGUAL | 0 refills | Status: AC | PRN
Start: 2023-06-06 — End: ?

## 2023-06-06 NOTE — Telephone Encounter (Signed)
 Patient called concerned about some symptoms she is experiencing she is having real sharp pains in the left side, seeing blood when she wipes, worsening back pain, and pain while she walks. She is scheduled for surgery on 06/12/23 with [facility]. Plus the medication that has been helping her thru this she is out of  which is hyoscyamine  (LEVSIN SL) 0.125 MG SL tablet. Please advise.

## 2023-06-07 ENCOUNTER — Telehealth: Payer: Self-pay | Admitting: Urology

## 2023-06-07 ENCOUNTER — Encounter: Payer: Self-pay | Admitting: Urology

## 2023-06-07 ENCOUNTER — Ambulatory Visit (HOSPITAL_BASED_OUTPATIENT_CLINIC_OR_DEPARTMENT_OTHER)
Admission: RE | Admit: 2023-06-07 | Discharge: 2023-06-07 | Disposition: A | Discharge status: Home / Self Care | Payer: PRIVATE HEALTH INSURANCE | Source: Ambulatory Visit | Attending: Urology | Admitting: Urology

## 2023-06-07 ENCOUNTER — Ambulatory Visit (INDEPENDENT_AMBULATORY_CARE_PROVIDER_SITE_OTHER): Payer: PRIVATE HEALTH INSURANCE | Admitting: Urology

## 2023-06-07 ENCOUNTER — Other Ambulatory Visit: Payer: Self-pay | Admitting: Urology

## 2023-06-07 VITALS — BP 134/83 | HR 114 | Ht 64.0 in | Wt 183.0 lb

## 2023-06-07 DIAGNOSIS — N201 Calculus of ureter: Secondary | ICD-10-CM

## 2023-06-07 DIAGNOSIS — Z87440 Personal history of urinary (tract) infections: Secondary | ICD-10-CM

## 2023-06-07 LAB — URINALYSIS, ROUTINE W REFLEX MICROSCOPIC
Bilirubin, UA: NEGATIVE
Glucose, UA: NEGATIVE
Ketones, UA: NEGATIVE
Nitrite, UA: NEGATIVE
Specific Gravity, UA: 1.015 (ref 1.005–1.030)
Urobilinogen, Ur: 1 mg/dL (ref 0.2–1.0)
pH, UA: 5.5 (ref 5.0–7.5)

## 2023-06-07 LAB — MICROSCOPIC EXAMINATION: RBC, Urine: >30 /HPF — AB (ref 0–2)

## 2023-06-07 NOTE — H&P (View-Only) (Signed)
 Assessment: 1. Ureteral calculus   2. History of UTI     Plan: I reviewed the KUB from today.  The stent appears to be in good position. I do not see any evidence of a UTI today.  Her symptoms are likely due to stent irritation.  I recommended that she continue the daily cephalexin . I also gave her samples of Gemtesa to help with bladder spasms. Continue tamsulosin .  Procedure: The patient will be scheduled for cystoscopy, left retrograde pyelogram, left ureteroscopy, possible laser lithotripsy, exchange of left ureteral stent at [facility].  Surgical request is placed with the surgery schedulers and will be scheduled at the patient's/family request. Informed consent is given as documented below. Anesthesia: General  The patient does not have sleep apnea, history of MRSA, history of VRE, history of cardiac device requiring special anesthetic needs. Patient is stable and considered clear for surgical management in an outpatient ambulatory surgery setting as well as inpatient hospital setting.  Consent for Operation or Procedure: Provider Certification I hereby certify that the nature, purpose, benefits, usual and most frequent risks of, and alternatives to, the operation or procedure have been explained to the patient (or person authorized to sign for the patient) either by me as responsible physician or by the provider who is to perform the operation or procedure. Time spent such that the patient/family has had an opportunity to ask questions, and that those questions have been answered. The patient or the patient's representative has been advised that selected tasks may be performed by assistants to the primary health care provider(s). I believe that the patient (or person authorized to sign for the patient) understands what has been explained, and has consented to the operation or procedure. No guarantees were implied or made.    Chief Complaint: Chief Complaint  Patient presents with    Nephrolithiasis    HPI: Valerie Merritt is a 53 y.o. female who presents for continued evaluation of a left ureteral calculus. She presented to the emergency room on 05/21/23 with a 2 mm proximal left obstructing stone with associated fever and chills.  She underwent cystoscopy with left ureteral stent insertion on 05/22/2023.  Urine culture showed no growth.  However, she had been on antibiotics for several days prior to her presentation.  She did well following the procedure.  Her flank pain had resolved.  She was having stent related symptoms with frequency, and urgency.  No fevers or chills.  She completed the cefdinir .  She presents today for urgent evaluation of increased symptoms including flank pain and gross hematuria.  She is also having increased symptoms in the bladder area.  Her symptoms began yesterday.  The left flank pain has improved today.  She continues to have discomfort in the bladder and vaginal area.  Portions of the above documentation were copied from a prior visit for review purposes only.  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  PSH: Past Surgical History:  Procedure Laterality Date   APPENDECTOMY  1985   CHOLECYSTECTOMY N/A 02/14/2014   Procedure: LAPAROSCOPIC CHOLECYSTECTOMY;  Surgeon: Sim Dryer, MD;  Location: [facility] OR;  Service: General;  Laterality: N/A;   CYSTOSCOPY WITH STENT PLACEMENT Left 05/21/2023   Procedure: CYSTOSCOPY, WITH STENT INSERTION;  Surgeon: Scarlet Curly,  MD;  Location: [facility] OR;  Service: Urology;  Laterality: Left;   INTRAUTERINE DEVICE (IUD) INSERTION  12/2011   URETHRAL DILATION      SH: Social History   Tobacco Use   Smoking status: Every Day    Current packs/day: 1.00    Average packs/day: 1 pack/day for 25.0 years (25.0 ttl pk-yrs)    Types: Cigarettes   Smokeless tobacco: Never   Tobacco comments:    She is down to 1/2 ppd;   Vaping Use   Vaping status: Never Used  Substance Use Topics   Alcohol use: No   Drug use: No    ROS: Constitutional:  Negative for fever, chills, weight loss CV: Negative for chest pain, previous MI, hypertension Respiratory:  Negative for shortness of breath, wheezing, sleep apnea, frequent cough GI:  Negative for nausea, vomiting, bloody stool, GERD  PE: BP 134/83   Pulse (!) 114   Ht 5\' 4"  (1.626 m)   Wt 183 lb (83 kg)   BMI 31.41 kg/m  GENERAL APPEARANCE:  Well appearing, well developed, well nourished, NAD HEENT:  Atraumatic, normocephalic, oropharynx clear NECK:  Supple without lymphadenopathy or thyromegaly ABDOMEN:  Soft, non-tender, no masses EXTREMITIES:  Moves all extremities well, without clubbing, cyanosis, or edema NEUROLOGIC:  Alert and oriented x 3, normal gait, CN II-XII grossly intact MENTAL STATUS:  appropriate BACK:  Non-tender to palpation, No CVAT SKIN:  Warm, dry, and intact   Results for orders placed or performed in visit on 06/07/23 (from the past 24 hours)  Urinalysis, Routine w reflex microscopic     Status: Abnormal   Collection Time: 06/07/23 12:00 AM  Result Value Ref Range   Specific Gravity, UA 1.015 1.005 - 1.030   pH, UA 5.5 5.0 - 7.5   Color, UA Yellow Yellow   Appearance Ur Clear Clear   Leukocytes,UA Trace (A) Negative   Protein,UA 3+ (A) Negative/Trace   Glucose, UA Negative Negative   Ketones, UA Negative Negative   RBC, UA 3+ (A) Negative   Bilirubin, UA Negative Negative   Urobilinogen, Ur 1.0 0.2 - 1.0 mg/dL   Nitrite, UA Negative  Negative   Microscopic Examination See below:    Narrative   Performed at:  [facility]  Urology [facility] [address], [place], [place]  [id number] Lab Director: Estill Hemming [place], Phone:  [phone]  Microscopic Examination     Status: Abnormal   Collection Time: 06/07/23 12:00 AM   Urine  Result Value Ref Range   WBC, UA 0-5 0 - 5 /hpf   RBC, Urine >30 (A) 0 - 2 /hpf   Epithelial Cells (non renal) 0-10 0 - 10 /hpf   Crystals Present N/A   Crystal Type Calcium Oxalate N/A   Bacteria, UA Few None seen/Few   Narrative   Performed at:  [email or web address]  Urology [facility] [address], [place], [place]  [id number] Lab Director: Estill Hemming [place], Phone:  [phone]

## 2023-06-07 NOTE — Telephone Encounter (Signed)
 Patient called with questions and concerns regarding some bleeding and discomfort. She has a stent and is scheduled for surgery on 4/28 with Dr. Willye Harvey. I answered her questions and let her know that I would have him call her back after he was finished with morning clinic. I sent a staff message to him with her concerns. Valerie Merritt

## 2023-06-07 NOTE — Progress Notes (Signed)
 Assessment: 1. Ureteral calculus   2. History of UTI     Plan: I reviewed the KUB from today.  The stent appears to be in good position. I do not see any evidence of a UTI today.  Her symptoms are likely due to stent irritation.  I recommended that she continue the daily cephalexin . I also gave her samples of Gemtesa to help with bladder spasms. Continue tamsulosin .  Procedure: The patient will be scheduled for cystoscopy, left retrograde pyelogram, left ureteroscopy, possible laser lithotripsy, exchange of left ureteral stent at [facility].  Surgical request is placed with the surgery schedulers and will be scheduled at the patient's/family request. Informed consent is given as documented below. Anesthesia: General  The patient does not have sleep apnea, history of MRSA, history of VRE, history of cardiac device requiring special anesthetic needs. Patient is stable and considered clear for surgical management in an outpatient ambulatory surgery setting as well as inpatient hospital setting.  Consent for Operation or Procedure: Provider Certification I hereby certify that the nature, purpose, benefits, usual and most frequent risks of, and alternatives to, the operation or procedure have been explained to the patient (or person authorized to sign for the patient) either by me as responsible physician or by the provider who is to perform the operation or procedure. Time spent such that the patient/family has had an opportunity to ask questions, and that those questions have been answered. The patient or the patient's representative has been advised that selected tasks may be performed by assistants to the primary health care provider(s). I believe that the patient (or person authorized to sign for the patient) understands what has been explained, and has consented to the operation or procedure. No guarantees were implied or made.    Chief Complaint: Chief Complaint  Patient presents with    Nephrolithiasis    HPI: Valerie Merritt is a 53 y.o. female who presents for continued evaluation of a left ureteral calculus. She presented to the emergency room on 05/21/23 with a 2 mm proximal left obstructing stone with associated fever and chills.  She underwent cystoscopy with left ureteral stent insertion on 05/22/2023.  Urine culture showed no growth.  However, she had been on antibiotics for several days prior to her presentation.  She did well following the procedure.  Her flank pain had resolved.  She was having stent related symptoms with frequency, and urgency.  No fevers or chills.  She completed the cefdinir .  She presents today for urgent evaluation of increased symptoms including flank pain and gross hematuria.  She is also having increased symptoms in the bladder area.  Her symptoms began yesterday.  The left flank pain has improved today.  She continues to have discomfort in the bladder and vaginal area.  Portions of the above documentation were copied from a prior visit for review purposes only.  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  PSH: Past Surgical History:  Procedure Laterality Date   APPENDECTOMY  1985   CHOLECYSTECTOMY N/A 02/14/2014   Procedure: LAPAROSCOPIC CHOLECYSTECTOMY;  Surgeon: Sim Dryer, MD;  Location: [facility] OR;  Service: General;  Laterality: N/A;   CYSTOSCOPY WITH STENT PLACEMENT Left 05/21/2023   Procedure: CYSTOSCOPY, WITH STENT INSERTION;  Surgeon: Scarlet Curly,  MD;  Location: [facility] OR;  Service: Urology;  Laterality: Left;   INTRAUTERINE DEVICE (IUD) INSERTION  12/2011   URETHRAL DILATION      SH: Social History   Tobacco Use   Smoking status: Every Day    Current packs/day: 1.00    Average packs/day: 1 pack/day for 25.0 years (25.0 ttl pk-yrs)    Types: Cigarettes   Smokeless tobacco: Never   Tobacco comments:    She is down to 1/2 ppd;   Vaping Use   Vaping status: Never Used  Substance Use Topics   Alcohol use: No   Drug use: No    ROS: Constitutional:  Negative for fever, chills, weight loss CV: Negative for chest pain, previous MI, hypertension Respiratory:  Negative for shortness of breath, wheezing, sleep apnea, frequent cough GI:  Negative for nausea, vomiting, bloody stool, GERD  PE: BP 134/83   Pulse (!) 114   Ht 5\' 4"  (1.626 m)   Wt 183 lb (83 kg)   BMI 31.41 kg/m  GENERAL APPEARANCE:  Well appearing, well developed, well nourished, NAD HEENT:  Atraumatic, normocephalic, oropharynx clear NECK:  Supple without lymphadenopathy or thyromegaly ABDOMEN:  Soft, non-tender, no masses EXTREMITIES:  Moves all extremities well, without clubbing, cyanosis, or edema NEUROLOGIC:  Alert and oriented x 3, normal gait, CN II-XII grossly intact MENTAL STATUS:  appropriate BACK:  Non-tender to palpation, No CVAT SKIN:  Warm, dry, and intact   Results for orders placed or performed in visit on 06/07/23 (from the past 24 hours)  Urinalysis, Routine w reflex microscopic     Status: Abnormal   Collection Time: 06/07/23 12:00 AM  Result Value Ref Range   Specific Gravity, UA 1.015 1.005 - 1.030   pH, UA 5.5 5.0 - 7.5   Color, UA Yellow Yellow   Appearance Ur Clear Clear   Leukocytes,UA Trace (A) Negative   Protein,UA 3+ (A) Negative/Trace   Glucose, UA Negative Negative   Ketones, UA Negative Negative   RBC, UA 3+ (A) Negative   Bilirubin, UA Negative Negative   Urobilinogen, Ur 1.0 0.2 - 1.0 mg/dL   Nitrite, UA Negative  Negative   Microscopic Examination See below:    Narrative   Performed at:  [facility]  Urology [facility] [address], [place], [place]  [id number] Lab Director: Estill Hemming [place], Phone:  [phone]  Microscopic Examination     Status: Abnormal   Collection Time: 06/07/23 12:00 AM   Urine  Result Value Ref Range   WBC, UA 0-5 0 - 5 /hpf   RBC, Urine >30 (A) 0 - 2 /hpf   Epithelial Cells (non renal) 0-10 0 - 10 /hpf   Crystals Present N/A   Crystal Type Calcium Oxalate N/A   Bacteria, UA Few None seen/Few   Narrative   Performed at:  01 - [email or web address]  Urology [facility] [address], [place], [place]  [id number] Lab Director: Estill Hemming [place], Phone:  [phone]

## 2023-06-08 NOTE — Patient Instructions (Signed)
 SURGICAL WAITING ROOM VISITATION Patients having surgery or a procedure may have no more than 2 support people in the waiting area - these visitors may rotate in the visitor waiting room.   If the patient needs to stay at the hospital during part of their recovery, the visitor guidelines for inpatient rooms apply.  PRE-OP VISITATION  Pre-op nurse will coordinate an appropriate time for 1 support person to accompany the patient in pre-op.  This support person may not rotate.  This visitor will be contacted when the time is appropriate for the visitor to come back in the pre-op area.  Please refer to the [facility] website for the visitor guidelines for Inpatients (after your surgery is over and you are in a regular room).  You are not required to quarantine at this time prior to your surgery. However, you must do this: Hand Hygiene often Do NOT share personal items Notify your provider if you are in close contact with someone who has COVID or you develop fever 100.4 or greater, new onset of sneezing, cough, sore throat, shortness of breath or body aches.  If you test positive for Covid or have been in contact with anyone that has tested positive in the last 10 days please notify you surgeon.    Your procedure is scheduled on:  Monday  June 12, 2023  Report to [facility] Main Entrance: [address] entrance where the [employer] is available.   Report to admitting at:  11:30   AM  Call this number if you have any questions or problems the morning of surgery [phone]  DO NOT EAT OR DRINK ANYTHING AFTER MIDNIGHT THE NIGHT PRIOR TO YOUR SURGERY / PROCEDURE.   FOLLOW  ANY ADDITIONAL PRE OP INSTRUCTIONS YOU RECEIVED FROM YOUR SURGEON'S OFFICE!!!   Oral Hygiene is also important to reduce your risk of infection.        Remember - BRUSH YOUR TEETH THE MORNING OF SURGERY WITH YOUR REGULAR TOOTHPASTE  Do NOT smoke after Midnight the night before surgery.  STOP TAKING all Vitamins,  Herbs and supplements 1 week before your surgery.   Take ONLY these medicines the morning of surgery with A SIP OF WATER: Tamsulosin , cephalexin  (Keflex ), amlodipine , escitalopram  and Tylenol  if needed.                     You may not have any metal on your body including hair pins, jewelry, and body piercing  Do not wear make-up, lotions, powders, perfumes  or deodorant  Do not wear nail polish including gel and S&S, artificial / acrylic nails, or any other type of covering on natural nails including finger and toenails. If you have artificial nails, gel coating, etc., that needs to be removed by a nail salon, Please have this removed prior to surgery. Not doing so may mean that your surgery could be cancelled or delayed if the Surgeon or anesthesia staff feels like they are unable to monitor you safely.   Do not shave 48 hours prior to surgery to avoid nicks in your skin which may contribute to postoperative infections.   Contacts, Hearing Aids, dentures or bridgework may not be worn into surgery. DENTURES WILL BE REMOVED PRIOR TO SURGERY PLEASE DO NOT APPLY "Poly grip" OR ADHESIVES!!!  Patients discharged on the day of surgery will not be allowed to drive home.  Someone NEEDS to stay with you for the first 24 hours after anesthesia.  Do not bring your home  medications to the hospital. The Pharmacy will dispense medications listed on your medication list to you during your admission in the Hospital.  Special Instructions: Bring a copy of your healthcare power of attorney and living will documents the day of surgery, if you wish to have them scanned into your [facility] Records- EPIC  Please read over the following fact sheets you were given: IF YOU HAVE QUESTIONS ABOUT YOUR PRE-OP INSTRUCTIONS, PLEASE CALL [phone].    [facility] - Preparing for Surgery Before surgery, you can play an important role.  Because skin is not sterile, your skin needs to be as free of germs as  possible.  You can reduce the number of germs on your skin by washing with Antibacterial soap before surgery.  . Do not shave (including legs and underarms) for at least 48 hours prior to the first shower.  You may shave your face/neck.  Please follow these instructions carefully:  1.  Shower with antibacterial Soap the night before surgery and the  morning of surgery.  2.  If you choose to wash your hair, wash your hair first as usual with your normal  shampoo.  3.  After you shampoo, rinse your hair and body thoroughly to remove the shampoo.                             4.  You can apply soap directly to the skin and wash.  Gently with a scrungie or clean washcloth.  5.  Wash face,  Genitals (private parts) with your normal soap.             6.  Wash thoroughly, paying special attention to the area where your  surgery  will be performed.  7.  Thoroughly rinse your body with warm water from the neck down.  8.   Pat yourself dry with a clean towel.            9  Wear clean pajamas.            10 Place clean sheets on your bed the night of your first shower and do not  sleep with pets.  ON THE DAY OF SURGERY : Do not apply any lotions/deodorants the morning of surgery.  Please wear clean clothes to the hospital/surgery center.  FAILURE TO FOLLOW THESE INSTRUCTIONS MAY RESULT IN THE CANCELLATION OF YOUR SURGERY  PATIENT SIGNATURE_________________________________  NURSE SIGNATURE__________________________________

## 2023-06-08 NOTE — Progress Notes (Signed)
 The patient was identified using 2 approved identifiers. All issues noted in this document were discussed and addressed, Ms  Bjelland  voiced understanding and agreement with all preoperative instructions. The patient was emailed the surgery instructions per her request.      Patient was admitted to [facility] from 05-21-23 to 05-24-2023 . Labs were done at this time.  Medication Recon. Was done 06-05-2023  COVID Vaccine received:  []  No []  Yes Date of any COVID positive Test in last 90 days:  PCP - Burney Carter, NP  Cardiologist -   Chest x-ray -  EKG -  pt to bring on DOS, done last week at PCP Stress Test -  ECHO -  Cardiac Cath -   Bowel Prep - [x]  No  []   Yes ______  Pacemaker / ICD device [x]  No []  Yes   Spinal Cord Stimulator:[x]  No []  Yes       History of Sleep Apnea? [x]  No []  Yes   CPAP used?- [x]  No []  Yes    Does the patient monitor blood sugar?   [x]  N/A   []  No []  Yes  Patient has: [x]  NO Hx DM   []  Pre-DM   []  DM1  []   DM2  Blood Thinner / Instructions:  none Aspirin Instructions:  none  ERAS Protocol Ordered: [x]  No  []  Yes Patient is to be NPO after: midnight prior  Dental hx: []  Dentures:  [x]  N/A      []  Bridge or Partial:                   []  Loose or Damaged teeth:   Activity level: Patient is able to climb a flight of stairs without difficulty; [x]  No CP  [x]  No SOB   Patient can perform ADLs without assistance.   Anesthesia review: anxiety, HTN, TMJ, smoker  I called Moira Andrews at Dr. Jaquita Merl office and reminded them that the patient needs to be premedicated if he will be using any contrast in the surgery.   Patient denies shortness of breath, fever, cough and chest pain at PAT appointment.  Patient verbalized understanding and agreement to the Pre-Surgical Instructions that were given to them at this PAT appointment. Patient was also educated of the need to review these PAT instructions again prior to her surgery.I reviewed the appropriate phone numbers  to call if they have any and questions or concerns.

## 2023-06-09 ENCOUNTER — Telehealth: Payer: Self-pay | Admitting: Urology

## 2023-06-09 ENCOUNTER — Encounter (HOSPITAL_COMMUNITY)
Admission: RE | Admit: 2023-06-09 | Discharge: 2023-06-09 | Disposition: A | Discharge status: Home / Self Care | Payer: PRIVATE HEALTH INSURANCE | Source: Ambulatory Visit | Attending: Urology | Admitting: Urology

## 2023-06-09 ENCOUNTER — Encounter (HOSPITAL_COMMUNITY): Payer: Self-pay

## 2023-06-09 HISTORY — DX: Essential (primary) hypertension: I10

## 2023-06-09 HISTORY — DX: Unspecified osteoarthritis, unspecified site: M19.90

## 2023-06-09 NOTE — Telephone Encounter (Signed)
 I received a call from [facility] today and she stated that this patient may need to be pre-medicated prior to her surgery due to an allergy. She can be reached at [phone].

## 2023-06-12 ENCOUNTER — Ambulatory Visit (HOSPITAL_BASED_OUTPATIENT_CLINIC_OR_DEPARTMENT_OTHER): Payer: PRIVATE HEALTH INSURANCE | Admitting: Certified Registered"

## 2023-06-12 ENCOUNTER — Other Ambulatory Visit: Payer: Self-pay

## 2023-06-12 ENCOUNTER — Ambulatory Visit (HOSPITAL_COMMUNITY): Payer: PRIVATE HEALTH INSURANCE

## 2023-06-12 ENCOUNTER — Ambulatory Visit (HOSPITAL_COMMUNITY): Payer: PRIVATE HEALTH INSURANCE | Admitting: Certified Registered"

## 2023-06-12 ENCOUNTER — Encounter (HOSPITAL_COMMUNITY)
Admission: RE | Disposition: A | Discharge status: Home / Self Care | Payer: Self-pay | Source: Home / Self Care | Attending: Urology

## 2023-06-12 ENCOUNTER — Encounter (HOSPITAL_COMMUNITY): Payer: Self-pay | Admitting: Urology

## 2023-06-12 ENCOUNTER — Ambulatory Visit (HOSPITAL_COMMUNITY)
Admission: RE | Admit: 2023-06-12 | Discharge: 2023-06-12 | Disposition: A | Discharge status: Home / Self Care | Payer: PRIVATE HEALTH INSURANCE | Attending: Urology | Admitting: Urology

## 2023-06-12 DIAGNOSIS — Z87891 Personal history of nicotine dependence: Secondary | ICD-10-CM | POA: Insufficient documentation

## 2023-06-12 DIAGNOSIS — N201 Calculus of ureter: Secondary | ICD-10-CM

## 2023-06-12 DIAGNOSIS — R310 Gross hematuria: Secondary | ICD-10-CM | POA: Insufficient documentation

## 2023-06-12 DIAGNOSIS — I10 Essential (primary) hypertension: Secondary | ICD-10-CM | POA: Insufficient documentation

## 2023-06-12 DIAGNOSIS — F419 Anxiety disorder, unspecified: Secondary | ICD-10-CM | POA: Diagnosis not present

## 2023-06-12 DIAGNOSIS — E669 Obesity, unspecified: Secondary | ICD-10-CM | POA: Insufficient documentation

## 2023-06-12 DIAGNOSIS — Z87440 Personal history of urinary (tract) infections: Secondary | ICD-10-CM | POA: Insufficient documentation

## 2023-06-12 DIAGNOSIS — R519 Headache, unspecified: Secondary | ICD-10-CM | POA: Insufficient documentation

## 2023-06-12 DIAGNOSIS — Z6831 Body mass index (BMI) 31.0-31.9, adult: Secondary | ICD-10-CM | POA: Insufficient documentation

## 2023-06-12 DIAGNOSIS — M1990 Unspecified osteoarthritis, unspecified site: Secondary | ICD-10-CM | POA: Insufficient documentation

## 2023-06-12 HISTORY — PX: CYSTOSCOPY/URETEROSCOPY/HOLMIUM LASER/STENT PLACEMENT: SHX6546

## 2023-06-12 SURGERY — CYSTOSCOPY/URETEROSCOPY/HOLMIUM LASER/STENT PLACEMENT
Anesthesia: General | Site: Ureter | Laterality: Left

## 2023-06-12 MED ORDER — EPHEDRINE SULFATE-NACL 50-0.9 MG/10ML-% IV SOSY
PREFILLED_SYRINGE | INTRAVENOUS | Status: DC | PRN
  Administered 2023-06-12 (×2): 5 mg via INTRAVENOUS

## 2023-06-12 MED ORDER — ONDANSETRON HCL 4 MG/2ML IJ SOLN
INTRAMUSCULAR | Status: DC | PRN
  Administered 2023-06-12: 4 mg via INTRAVENOUS

## 2023-06-12 MED ORDER — DEXAMETHASONE SODIUM PHOSPHATE 10 MG/ML IJ SOLN
INTRAMUSCULAR | Status: AC
  Filled 2023-06-12: qty 1

## 2023-06-12 MED ORDER — LIDOCAINE HCL URETHRAL/MUCOSAL 2 % EX GEL
CUTANEOUS | Status: AC
  Filled 2023-06-12: qty 30

## 2023-06-12 MED ORDER — MIDAZOLAM HCL 2 MG/2ML IJ SOLN
INTRAMUSCULAR | Status: DC | PRN
  Administered 2023-06-12: 2 mg via INTRAVENOUS

## 2023-06-12 MED ORDER — KETOROLAC TROMETHAMINE 30 MG/ML IJ SOLN
INTRAMUSCULAR | Status: DC | PRN
  Administered 2023-06-12: 15 mg via INTRAVENOUS

## 2023-06-12 MED ORDER — LACTATED RINGERS IV SOLN: INTRAVENOUS | Status: DC

## 2023-06-12 MED ORDER — PROPOFOL 10 MG/ML IV BOLUS
INTRAVENOUS | Status: DC | PRN
  Administered 2023-06-12: 160 mg via INTRAVENOUS

## 2023-06-12 MED ORDER — CHLORHEXIDINE GLUCONATE 0.12 % MT SOLN
15.0000 mL | Freq: Once | OROMUCOSAL | Status: AC
  Administered 2023-06-12: 15 mL via OROMUCOSAL

## 2023-06-12 MED ORDER — KETOROLAC TROMETHAMINE 30 MG/ML IJ SOLN
INTRAMUSCULAR | Status: AC
  Filled 2023-06-12: qty 1

## 2023-06-12 MED ORDER — SODIUM CHLORIDE 0.9 % IR SOLN
Status: DC | PRN
  Administered 2023-06-12: 3000 mL

## 2023-06-12 MED ORDER — MIDAZOLAM HCL 2 MG/2ML IJ SOLN
INTRAMUSCULAR | Status: AC
  Filled 2023-06-12: qty 2

## 2023-06-12 MED ORDER — HYDROMORPHONE HCL 1 MG/ML IJ SOLN
INTRAMUSCULAR | Status: AC
  Filled 2023-06-12: qty 1

## 2023-06-12 MED ORDER — FENTANYL CITRATE (PF) 100 MCG/2ML IJ SOLN
INTRAMUSCULAR | Status: AC
Start: 2023-06-12 — End: ?
  Filled 2023-06-12: qty 2

## 2023-06-12 MED ORDER — HYDROMORPHONE HCL 1 MG/ML IJ SOLN
0.2500 mg | INTRAMUSCULAR | Status: DC | PRN
  Administered 2023-06-12 (×3): 0.5 mg via INTRAVENOUS
  Filled 2023-06-12: qty 0.5

## 2023-06-12 MED ORDER — EPHEDRINE 5 MG/ML INJ
INTRAVENOUS | Status: AC
  Filled 2023-06-12: qty 10

## 2023-06-12 MED ORDER — DEXAMETHASONE SODIUM PHOSPHATE 10 MG/ML IJ SOLN
INTRAMUSCULAR | Status: DC | PRN
  Administered 2023-06-12: 4 mg via INTRAVENOUS

## 2023-06-12 MED ORDER — FENTANYL CITRATE (PF) 100 MCG/2ML IJ SOLN
INTRAMUSCULAR | Status: DC | PRN
  Administered 2023-06-12: 25 ug via INTRAVENOUS
  Administered 2023-06-12: 50 ug via INTRAVENOUS
  Administered 2023-06-12: 25 ug via INTRAVENOUS

## 2023-06-12 MED ORDER — ONDANSETRON HCL 4 MG/2ML IJ SOLN
INTRAMUSCULAR | Status: AC
  Filled 2023-06-12: qty 2

## 2023-06-12 MED ORDER — CIPROFLOXACIN IN D5W 400 MG/200ML IV SOLN
400.0000 mg | INTRAVENOUS | Status: AC
Start: 2023-06-12 — End: 2023-06-12
  Administered 2023-06-12: 400 mg via INTRAVENOUS
  Filled 2023-06-12: qty 200

## 2023-06-12 MED ORDER — LIDOCAINE HCL (CARDIAC) PF 100 MG/5ML IV SOSY
PREFILLED_SYRINGE | INTRAVENOUS | Status: DC | PRN
Start: 2023-06-12 — End: 2023-06-12
  Administered 2023-06-12: 40 mg via INTRAVENOUS

## 2023-06-12 MED ORDER — ORAL CARE MOUTH RINSE: 15.0000 mL | Freq: Once | OROMUCOSAL | Status: AC

## 2023-06-12 MED ORDER — ACETAMINOPHEN 500 MG PO TABS
1000.0000 mg | ORAL_TABLET | Freq: Once | ORAL | Status: AC
  Administered 2023-06-12: 1000 mg via ORAL
  Filled 2023-06-12: qty 2

## 2023-06-12 MED ORDER — PROPOFOL 10 MG/ML IV BOLUS
INTRAVENOUS | Status: AC
  Filled 2023-06-12: qty 20

## 2023-06-12 SURGICAL SUPPLY — 18 items
BAG URO CATCHER STRL LF (MISCELLANEOUS) ×1 IMPLANT
BASKET ZERO TIP NITINOL 2.4FR (BASKET) IMPLANT
CATH URETL OPEN 5X70 (CATHETERS) IMPLANT
CLOTH BEACON ORANGE TIMEOUT ST (SAFETY) ×1 IMPLANT
EXTRACTOR STONE 1.7FRX115CM (UROLOGICAL SUPPLIES) IMPLANT
GLOVE SURG LX STRL 8.0 MICRO (GLOVE) ×1 IMPLANT
GOWN SPEC L4 XLG W/TWL (GOWN DISPOSABLE) ×1 IMPLANT
GUIDEWIRE STR DUAL SENSOR (WIRE) ×1 IMPLANT
GUIDEWIRE ZIPWRE .038 STRAIGHT (WIRE) IMPLANT
KIT TURNOVER KIT A (KITS) IMPLANT
LASER FIB FLEXIVA PULSE ID 365 (Laser) IMPLANT
MANIFOLD NEPTUNE II (INSTRUMENTS) ×1 IMPLANT
PACK CYSTO (CUSTOM PROCEDURE TRAY) ×1 IMPLANT
SHEATH NAVIGATOR HD 12/14X36 (SHEATH) IMPLANT
SHEATH URETERAL FLEX 12X35 (SHEATH) IMPLANT
TRACTIP FLEXIVA PULS ID 200XHI (Laser) IMPLANT
TUBING CONNECTING 10 (TUBING) ×1 IMPLANT
TUBING UROLOGY SET (TUBING) ×1 IMPLANT

## 2023-06-12 NOTE — Op Note (Signed)
 OPERATIVE NOTE   Patient Name: Valerie Merritt  MRN: [id number]   Date of Procedure: 06/12/23    Preoperative diagnosis:  Left ureteral calculus  Postoperative diagnosis:  Left ureteral calculus  Procedure:  Cystoscopy  Left ureteroscopy with stone manipulation (obtained)  Attending: Mellie Sprinkle, MD  Anesthesia: General  Estimated blood loss: Minimal  Fluids: Per anesthesia record  Drains: None  Specimens: Calculus given to patient  Antibiotics: Cipro  400 mg IV  Findings:  - Normal urethra and bladder -2 mm calculus in proximal left ureter with 2 smaller calculi  Indications:  53 year old female presents for surgical management of a left ureteral calculus.  She initially presented to the emergency room on 05/21/2023 with a 2 mm proximal left obstructing stone with associated fever and chills.  She underwent cystoscopy with left ureteral stent insertion on 05/22/2023.  Urine culture showed no growth; however, she had been on antibiotics prior to her presentation.  She presents now for definitive management of the left ureteral calculus with cystoscopy, left ureteroscopy, possible laser lithotripsy and possible exchange of ureteral stent.  She has a IV contrast allergy with reported anaphylaxis and would like to avoid a retrograde pyelogram if possible. Risk and benefits of the procedure discussed in detail.  She understands and wishes to proceed as described.  Description of Procedure:  The patient was taken to the operating room suite and properly identified.  She received IV ciprofloxacin .  After successful induction of a general anesthetic, she was placed in the dorsolithotomy position.  The patient's genital area was prepped and draped in sterile fashion.  A preoperative timeout was performed.  Fluoroscopy demonstrated a left ureteral stent and good position.  No obvious calcifications were seen along the course of the stent.  A 21 French rigid cystoscope was  passed through the urethra into the bladder.  The left ureteral stent was seen emanating from the left ureteral orifice.  Inspection of the bladder demonstrated no other mucosal abnormalities.  No stones are seen within the bladder.  A sensor guidewire was passed into the left ureter alongside the ureteral stent.  The wire was passed up to the left collecting system under fluoroscopic guidance.  Using stent graspers, the left ureteral stent was removed.  Ureteroscopy was performed alongside the guidewire.  A small 2 mm calculus with 2 smaller fragments were identified in the proximal left ureter.  Using an Ngage known basket, these fragments were carefully removed.  The smallest fragment was too small to be contained within the basket and was felt to be small enough to pass spontaneously.  Inspection of the ureter demonstrated no injury.  The ureter was nicely dilated since her stent had been in place for several weeks.,  I elected not to replace a ureteral stent as she was having significant stent symptoms preoperatively.  The ureteroscope was removed.  The bladder was drained through the cystoscope.  Intraurethral lidocaine  jelly was placed.  The patient was then extubated and taken to the postanesthesia care unit in stable condition.  Complications: None  Condition: Stable, extubated, transferred to PACU  Plan:  Discharge to home Return to office in 3-4 weeks.

## 2023-06-12 NOTE — Transfer of Care (Signed)
 Immediate Anesthesia Transfer of Care Note  Patient: Valerie Merritt  Procedure(s) Performed: CYSTOSCOPY/URETEROSCOPY/STONE BASKETING/STENT REMOVAL (Left: Ureter)  Patient Location: PACU  Anesthesia Type:General  Level of Consciousness: awake, alert , and patient cooperative  Airway & Oxygen Therapy: Patient Spontanous Breathing and Patient connected to face mask oxygen  Post-op Assessment: Report given to RN and Post -op Vital signs reviewed and stable  Post vital signs: Reviewed and stable  Last Vitals:  Vitals Value Taken Time  BP 127/83 06/12/23 1438  Temp 36.4 C 06/12/23 1438  Pulse 96 06/12/23 1439  Resp 19 06/12/23 1439  SpO2 100 % 06/12/23 1439  Vitals shown include unfiled device data.  Last Pain:  Vitals:   06/12/23 1233  TempSrc:   PainSc: 0-No pain         Complications: No notable events documented.

## 2023-06-12 NOTE — Anesthesia Postprocedure Evaluation (Signed)
 Anesthesia Post Note  Patient: Valerie Merritt  Procedure(s) Performed: CYSTOSCOPY/URETEROSCOPY/STONE BASKETING/STENT REMOVAL (Left: Ureter)     Patient location during evaluation: PACU Anesthesia Type: General Level of consciousness: awake and alert Pain management: pain level controlled Vital Signs Assessment: post-procedure vital signs reviewed and stable Respiratory status: spontaneous breathing, nonlabored ventilation and respiratory function stable Cardiovascular status: blood pressure returned to baseline and stable Postop Assessment: no apparent nausea or vomiting Anesthetic complications: no  No notable events documented.  Last Vitals:  Vitals:   06/12/23 1600 06/12/23 1616  BP: 106/89 117/80  Pulse: 81 93  Resp: 15   Temp: 36.4 C   SpO2: 96% 95%    Last Pain:  Vitals:   06/12/23 1616  TempSrc:   PainSc: 2                  Cynthia Cogle,W. EDMOND

## 2023-06-12 NOTE — Anesthesia Procedure Notes (Signed)
 Procedure Name: LMA Insertion Date/Time: 06/12/2023 1:54 PM  Performed by: Alwyn Juba, CRNAPre-anesthesia Checklist: Patient identified, Emergency Drugs available, Suction available, Patient being monitored and Timeout performed Patient Re-evaluated:Patient Re-evaluated prior to induction Oxygen Delivery Method: Circle system utilized Preoxygenation: Pre-oxygenation with 100% oxygen Induction Type: IV induction Ventilation: Mask ventilation without difficulty LMA: LMA inserted LMA Size: 4.0 Number of attempts: 1 Tube secured with: Tape Dental Injury: Teeth and Oropharynx as per pre-operative assessment

## 2023-06-12 NOTE — Interval H&P Note (Signed)
 History and Physical Interval Note:  06/12/2023 1:38 PM  Valerie Merritt  has presented today for surgery, with the diagnosis of History of urinary tract infection Ureteral calculus Left ureteral stone.  The various methods of treatment have been discussed with the patient and family. After consideration of risks, benefits and other options for treatment, the patient has consented to  Procedure(s): CYSTOSCOPY/URETEROSCOPY/HOLMIUM LASER/STENT PLACEMENT (Left) as a surgical intervention.  The patient's history has been reviewed, patient examined, no change in status, stable for surgery.  I have reviewed the patient's chart and labs.  Questions were answered to the patient's satisfaction.     [name]

## 2023-06-12 NOTE — Anesthesia Preprocedure Evaluation (Addendum)
 Anesthesia Evaluation  Patient identified by MRN, date of birth, ID band Patient awake    Reviewed: Allergy & Precautions, H&P , NPO status , Patient's Chart, lab work & pertinent test results  Airway Mallampati: II  TM Distance: >3 FB Neck ROM: Full    Dental no notable dental hx. (+) Teeth Intact, Dental Advisory Given   Pulmonary former smoker   Pulmonary exam normal breath sounds clear to auscultation       Cardiovascular hypertension, Pt. on medications  Rhythm:Regular Rate:Normal     Neuro/Psych  Headaches  Anxiety        GI/Hepatic negative GI ROS, Neg liver ROS,,,  Endo/Other  negative endocrine ROS    Renal/GU negative Renal ROS  negative genitourinary   Musculoskeletal  (+) Arthritis , Osteoarthritis,    Abdominal   Peds  Hematology negative hematology ROS (+)   Anesthesia Other Findings   Reproductive/Obstetrics negative OB ROS                             Anesthesia Physical Anesthesia Plan  ASA: 2  Anesthesia Plan: General   Post-op Pain Management: Tylenol  PO (pre-op)*   Induction: Intravenous  PONV Risk Score and Plan: 4 or greater and Ondansetron , Dexamethasone  and Midazolam   Airway Management Planned: LMA  Additional Equipment:   Intra-op Plan:   Post-operative Plan: Extubation in OR  Informed Consent: I have reviewed the patients History and Physical, chart, labs and discussed the procedure including the risks, benefits and alternatives for the proposed anesthesia with the patient or authorized representative who has indicated his/her understanding and acceptance.     Dental advisory given  Plan Discussed with: CRNA  Anesthesia Plan Comments:        Anesthesia Quick Evaluation

## 2023-06-13 ENCOUNTER — Encounter (HOSPITAL_COMMUNITY): Payer: Self-pay | Admitting: Urology

## 2023-06-20 ENCOUNTER — Encounter (HOSPITAL_COMMUNITY): Payer: PRIVATE HEALTH INSURANCE

## 2023-10-25 ENCOUNTER — Other Ambulatory Visit: Payer: Self-pay | Admitting: Family

## 2023-10-25 DIAGNOSIS — Z1231 Encounter for screening mammogram for malignant neoplasm of breast: Secondary | ICD-10-CM
